# Patient Record
Sex: Male | Born: 1950
Health system: Southern US, Community
[De-identification: ages and names within clinical notes are randomized; demographics above are authoritative.]

## PROBLEM LIST (undated history)

## (undated) ENCOUNTER — Emergency Department (HOSPITAL_COMMUNITY): Discharge status: Absent without leave | Payer: BLUE CROSS/BLUE SHIELD

## (undated) DIAGNOSIS — N400 Enlarged prostate without lower urinary tract symptoms: Secondary | ICD-10-CM

## (undated) DIAGNOSIS — I639 Cerebral infarction, unspecified: Secondary | ICD-10-CM

## (undated) DIAGNOSIS — I2510 Atherosclerotic heart disease of native coronary artery without angina pectoris: Secondary | ICD-10-CM

## (undated) DIAGNOSIS — G459 Transient cerebral ischemic attack, unspecified: Secondary | ICD-10-CM

## (undated) DIAGNOSIS — Z95820 Peripheral vascular angioplasty status with implants and grafts: Secondary | ICD-10-CM

## (undated) DIAGNOSIS — H269 Unspecified cataract: Secondary | ICD-10-CM

## (undated) DIAGNOSIS — I679 Cerebrovascular disease, unspecified: Secondary | ICD-10-CM

## (undated) DIAGNOSIS — G8929 Other chronic pain: Secondary | ICD-10-CM

## (undated) DIAGNOSIS — E785 Hyperlipidemia, unspecified: Secondary | ICD-10-CM

## (undated) DIAGNOSIS — I219 Acute myocardial infarction, unspecified: Secondary | ICD-10-CM

## (undated) DIAGNOSIS — R202 Paresthesia of skin: Secondary | ICD-10-CM

## (undated) DIAGNOSIS — I6521 Occlusion and stenosis of right carotid artery: Secondary | ICD-10-CM

## (undated) DIAGNOSIS — I10 Essential (primary) hypertension: Secondary | ICD-10-CM

## (undated) DIAGNOSIS — R200 Anesthesia of skin: Secondary | ICD-10-CM

## (undated) DIAGNOSIS — Z9889 Other specified postprocedural states: Secondary | ICD-10-CM

## (undated) HISTORY — DX: Transient cerebral ischemic attack, unspecified: G45.9

## (undated) HISTORY — DX: Cerebrovascular disease, unspecified: I67.9

## (undated) HISTORY — PX: CARDIAC CATHETERIZATION: SHX172

## (undated) HISTORY — PX: MULTIPLE TOOTH EXTRACTIONS: SHX2053

## (undated) HISTORY — DX: Essential (primary) hypertension: I10

## (undated) HISTORY — DX: Hyperlipidemia, unspecified: E78.5

## (undated) HISTORY — PX: CAROTID ENDARTERECTOMY: SUR193

## (undated) HISTORY — DX: Atherosclerotic heart disease of native coronary artery without angina pectoris: I25.10

---

## 2008-09-18 ENCOUNTER — Inpatient Hospital Stay (HOSPITAL_COMMUNITY): Admission: EM | Admit: 2008-09-18 | Discharge: 2008-09-20 | Payer: Self-pay

## 2008-09-18 ENCOUNTER — Encounter: Payer: Self-pay | Admitting: Emergency Medicine

## 2008-09-18 ENCOUNTER — Ambulatory Visit: Payer: Self-pay | Admitting: Cardiology

## 2008-09-18 ENCOUNTER — Ambulatory Visit: Payer: Self-pay | Admitting: Diagnostic Radiology

## 2008-09-19 ENCOUNTER — Encounter (INDEPENDENT_AMBULATORY_CARE_PROVIDER_SITE_OTHER): Payer: Self-pay | Admitting: Neurology

## 2008-09-19 ENCOUNTER — Encounter (INDEPENDENT_AMBULATORY_CARE_PROVIDER_SITE_OTHER): Payer: Self-pay | Admitting: Internal Medicine

## 2008-09-23 ENCOUNTER — Telehealth: Payer: Self-pay | Admitting: Cardiology

## 2008-09-25 ENCOUNTER — Telehealth (INDEPENDENT_AMBULATORY_CARE_PROVIDER_SITE_OTHER): Payer: Self-pay | Admitting: *Deleted

## 2008-09-26 ENCOUNTER — Ambulatory Visit: Payer: Self-pay | Admitting: Cardiology

## 2008-09-26 ENCOUNTER — Encounter (INDEPENDENT_AMBULATORY_CARE_PROVIDER_SITE_OTHER): Payer: Self-pay

## 2008-09-26 ENCOUNTER — Ambulatory Visit: Payer: Self-pay

## 2008-09-26 DIAGNOSIS — R9430 Abnormal result of cardiovascular function study, unspecified: Secondary | ICD-10-CM | POA: Insufficient documentation

## 2008-09-26 DIAGNOSIS — G459 Transient cerebral ischemic attack, unspecified: Secondary | ICD-10-CM | POA: Insufficient documentation

## 2008-09-26 DIAGNOSIS — I10 Essential (primary) hypertension: Secondary | ICD-10-CM | POA: Insufficient documentation

## 2008-09-26 LAB — CONVERTED CEMR LAB
Basophils Relative: 0.3 % (ref 0.0–3.0)
Chloride: 105 meq/L (ref 96–112)
Creatinine, Ser: 0.8 mg/dL (ref 0.4–1.5)
Eosinophils Relative: 1.4 % (ref 0.0–5.0)
Hemoglobin: 15.2 g/dL (ref 13.0–17.0)
INR: 1 (ref 0.8–1.0)
Lymphocytes Relative: 23 % (ref 12.0–46.0)
MCV: 98 fL (ref 78.0–100.0)
Monocytes Absolute: 0.7 10*3/uL (ref 0.1–1.0)
Neutro Abs: 4.2 10*3/uL (ref 1.4–7.7)
Neutrophils Relative %: 65 % (ref 43.0–77.0)
RBC: 4.3 M/uL (ref 4.22–5.81)
Sodium: 143 meq/L (ref 135–145)
WBC: 6.5 10*3/uL (ref 4.5–10.5)

## 2008-09-27 ENCOUNTER — Ambulatory Visit (HOSPITAL_COMMUNITY): Admission: RE | Admit: 2008-09-27 | Discharge: 2008-09-28 | Payer: Self-pay | Admitting: Cardiology

## 2008-09-27 ENCOUNTER — Ambulatory Visit: Payer: Self-pay | Admitting: Cardiology

## 2008-09-27 DIAGNOSIS — I429 Cardiomyopathy, unspecified: Secondary | ICD-10-CM | POA: Insufficient documentation

## 2008-09-27 DIAGNOSIS — E785 Hyperlipidemia, unspecified: Secondary | ICD-10-CM | POA: Insufficient documentation

## 2008-09-30 ENCOUNTER — Telehealth: Payer: Self-pay | Admitting: Cardiology

## 2008-09-30 DIAGNOSIS — R079 Chest pain, unspecified: Secondary | ICD-10-CM | POA: Insufficient documentation

## 2008-10-01 ENCOUNTER — Inpatient Hospital Stay (HOSPITAL_COMMUNITY): Admission: RE | Admit: 2008-10-01 | Discharge: 2008-10-02 | Payer: Self-pay | Admitting: Cardiology

## 2008-10-01 ENCOUNTER — Encounter: Payer: Self-pay | Admitting: Cardiology

## 2008-10-01 ENCOUNTER — Ambulatory Visit: Payer: Self-pay | Admitting: Cardiology

## 2008-10-31 ENCOUNTER — Ambulatory Visit: Payer: Self-pay | Admitting: Cardiology

## 2008-10-31 DIAGNOSIS — I2589 Other forms of chronic ischemic heart disease: Secondary | ICD-10-CM

## 2008-10-31 DIAGNOSIS — I255 Ischemic cardiomyopathy: Secondary | ICD-10-CM | POA: Insufficient documentation

## 2008-10-31 DIAGNOSIS — I739 Peripheral vascular disease, unspecified: Secondary | ICD-10-CM

## 2008-10-31 DIAGNOSIS — I2510 Atherosclerotic heart disease of native coronary artery without angina pectoris: Secondary | ICD-10-CM | POA: Insufficient documentation

## 2008-10-31 DIAGNOSIS — I779 Disorder of arteries and arterioles, unspecified: Secondary | ICD-10-CM | POA: Insufficient documentation

## 2008-11-01 ENCOUNTER — Telehealth: Payer: Self-pay | Admitting: Cardiology

## 2008-11-01 ENCOUNTER — Encounter: Payer: Self-pay | Admitting: Cardiology

## 2008-11-01 ENCOUNTER — Ambulatory Visit: Payer: Self-pay

## 2008-11-08 ENCOUNTER — Ambulatory Visit: Payer: Self-pay | Admitting: Cardiology

## 2008-11-11 LAB — CONVERTED CEMR LAB
CO2: 29 meq/L (ref 19–32)
Chloride: 109 meq/L (ref 96–112)
Glucose, Bld: 107 mg/dL — ABNORMAL HIGH (ref 70–99)
Sodium: 143 meq/L (ref 135–145)

## 2008-12-09 ENCOUNTER — Ambulatory Visit: Payer: Self-pay | Admitting: Cardiology

## 2008-12-09 LAB — CONVERTED CEMR LAB
Albumin: 4.3 g/dL (ref 3.5–5.2)
HDL: 34.3 mg/dL — ABNORMAL LOW (ref >39.00)
LDL Cholesterol: 69 mg/dL (ref 0–99)
Total CHOL/HDL Ratio: 4
Triglycerides: 94 mg/dL (ref 0.0–149.0)

## 2008-12-25 ENCOUNTER — Telehealth: Payer: Self-pay | Admitting: Cardiology

## 2009-02-26 ENCOUNTER — Telehealth: Payer: Self-pay | Admitting: Cardiology

## 2009-02-27 ENCOUNTER — Telehealth: Payer: Self-pay | Admitting: Cardiology

## 2009-05-20 ENCOUNTER — Telehealth: Payer: Self-pay | Admitting: Cardiology

## 2009-10-08 ENCOUNTER — Telehealth: Payer: Self-pay | Admitting: Cardiology

## 2009-10-15 ENCOUNTER — Encounter (INDEPENDENT_AMBULATORY_CARE_PROVIDER_SITE_OTHER): Payer: Self-pay | Admitting: *Deleted

## 2009-12-26 ENCOUNTER — Ambulatory Visit: Payer: Self-pay | Admitting: Cardiology

## 2010-01-23 ENCOUNTER — Ambulatory Visit: Payer: Self-pay | Admitting: Cardiology

## 2010-01-23 ENCOUNTER — Encounter: Payer: Self-pay | Admitting: Cardiology

## 2010-01-23 ENCOUNTER — Ambulatory Visit: Payer: Self-pay

## 2010-01-23 ENCOUNTER — Ambulatory Visit (HOSPITAL_COMMUNITY)
Admission: RE | Admit: 2010-01-23 | Discharge: 2010-01-23 | Payer: Self-pay | Source: Home / Self Care | Admitting: Cardiology

## 2010-01-27 ENCOUNTER — Telehealth: Payer: Self-pay | Admitting: Cardiology

## 2010-01-27 ENCOUNTER — Encounter: Payer: Self-pay | Admitting: Cardiology

## 2010-01-27 LAB — CONVERTED CEMR LAB
AST: 28 units/L (ref 0–37)
Alkaline Phosphatase: 55 units/L (ref 39–117)
BUN: 9 mg/dL (ref 6–23)
Bilirubin, Direct: 0.1 mg/dL (ref 0.0–0.3)
GFR calc non Af Amer: 91.27 mL/min (ref >60)
LDL Cholesterol: 62 mg/dL (ref 0–99)
Potassium: 4.3 meq/L (ref 3.5–5.1)
Sodium: 140 meq/L (ref 135–145)
Total Bilirubin: 0.9 mg/dL (ref 0.3–1.2)
Total CHOL/HDL Ratio: 3
VLDL: 12 mg/dL (ref 0.0–40.0)

## 2010-01-29 ENCOUNTER — Encounter: Payer: Self-pay | Admitting: Cardiology

## 2010-03-24 NOTE — Progress Notes (Signed)
Summary: want to discuss plavix  Phone Note Call from Patient Call back at Home Phone [phone]   Caller: Patient Reason for Call: Talk to Nurse Summary of Call: pt needs a refill on Plavix but he has some questions and concerns prior to getting the refill done Initial call taken by: Omer Jack,  February 27, 2009 9:06 AM  Follow-up for Phone Call        Phone Call Completed Deliah Goody, RN  February 27, 2009 11:05 AM     Prescriptions: PLAVIX 75 MG TABS (CLOPIDOGREL BISULFATE) Take one tablet by mouth daily  #90 x 3   Entered by:   Deliah Goody, RN   Authorized by:   Ferman Hamming, MD, [facility]   Signed by:   Deliah Goody, RN on 02/27/2009   Method used:   Electronically to        [facility]* (mail-order)             ,          Ph: [phone]       Fax: [phone]   RxID:   [id number]

## 2010-03-24 NOTE — Letter (Signed)
Summary: Appointment - Reschedule  [facility], Main Office  [address]   [place], [place] [address]   Phone: [phone]  Fax: [phone]     October 15, 2009 MRN: [id number]   Adam Andrews [address] [place], [place]  [address]   Dear [name],   Due to a change in our office schedule, your appointment on  11-19-2009                     at     11:45a.m.    must be changed.  It is very important that we reach you to reschedule this appointment. We look forward to participating in your health care needs. Please contact us at the number listed above at your earliest convenience to reschedule this appointment.     Sincerely,       Lorne Skeens  [facility] Scheduling Team

## 2010-03-24 NOTE — Letter (Signed)
Summary: Custom - Lipid  [facility], Main Office  [address]   [place], [place] [address]   Phone: [phone]  Fax: [phone]     January 27, 2010 MRN: [id number]   Adam Andrews [address] [place], [place]  [address]   Dear Mr. GASPARINI,  We have reviewed your cholesterol results.  They are as follows:     Total Cholesterol:    107 (Desirable: less than 200)       HDL  Cholesterol:     33.00  (Desirable: greater than 40 for men and 50 for women)       LDL Cholesterol:       62  (Desirable: less than 100 for low risk and less than 70 for moderate to high risk)       Triglycerides:       60.0  (Desirable: less than 150)  Our recommendations include:  KEEP UP THE GOOD WORK NO CHANGES  AT THIS TIME.   Call our office at the number listed above if you have any questions.  Lowering your LDL cholesterol is important, but it is only one of a large number of "risk factors" that may indicate that you are at risk for heart disease, stroke or other complications of hardening of the arteries.  Other risk factors include:   A.  Cigarette Smoking* B.  High Blood Pressure* C.  Obesity* D.   Low HDL Cholesterol (see yours above)* E.   Diabetes Mellitus (higher risk if your is uncontrolled) F.  Family history of premature heart disease G.  Previous history of stroke or cardiovascular disease    *These are risk factors YOU HAVE CONTROL OVER.  For more information, visit .  There is now evidence that lowering the TOTAL CHOLESTEROL AND LDL CHOLESTEROL can reduce the risk of heart disease.  The American Heart Association recommends the following guidelines for the treatment of elevated cholesterol:  1.  If there is now current heart disease and less than two risk factors, TOTAL CHOLESTEROL should be less than 200 and LDL CHOLESTEROL should be less than 100. 2.  If there is current heart disease or two or more risk factors, TOTAL CHOLESTEROL should be  less than 200 and LDL CHOLESTEROL should be less than 70.  A diet low in cholesterol, saturated fat, and calories is the cornerstone of treatment for elevated cholesterol.  Cessation of smoking and exercise are also important in the management of elevated cholesterol and preventing vascular disease.  Studies have shown that 30 to 60 minutes of physical activity most days can help lower blood pressure, lower cholesterol, and keep your weight at a healthy level.  Drug therapy is used when cholesterol levels do not respond to therapeutic lifestyle changes (smoking cessation, diet, and exercise) and remains unacceptably high.  If medication is started, it is important to have you levels checked periodically to evaluate the need for further treatment options.  Thank you,    [facility] Team

## 2010-03-24 NOTE — Assessment & Plan Note (Signed)
Summary: f1y/dm   CC:  check up.  History of Present Illness: Pleasant gentleman I recently saw in July of this year following a TIA. An echocardiogram showed an ejection fraction of approximately 45%. We therefore scheduled an outpatient Myoview for risk stratification. This revealed a prior lateral infarct but no ischemia. Ejection fraction was 38%. The patient and subsequently underwent cardiac catheterization on August 6 of 2010. This revealed a 50% lesion near the origin of a septal, a 50% first diagonal, a 50-60% LAD at the takeoff of the second diagonal and a 50 to 60% second diagonal. The ramus intermedius was occluded but filled via collaterals. The right coronary artery had a 90% lesion. The patient  had a drug-eluting stent to the right coronary artery on August 10 of 2010. Note a previous MRA revealed a 50% right carotid stenosis and 55% right vertebral artery stenosis. I last saw him in September of 2010. Since then he denies any dyspnea on exertion, orthopnea, PND, pedal edema, palpitations, syncope or chest pain.   Current Medications (verified): 1)  Carvedilol 6.25 Mg Tabs (Carvedilol) .... Take One Tablet By Mouth Twice A Day 2)  Simvastatin 40 Mg Tabs (Simvastatin) .... Take One Tablet By Mouth Daily At Bedtime 3)  Aspirin Ec 325 Mg Tbec (Aspirin) .... Take One Tablet By Mouth Daily 4)  Ibuprofen 200 Mg Tabs (Ibuprofen) .... As Needed 5)  Plavix 75 Mg Tabs (Clopidogrel Bisulfate) .... Take One Tablet By Mouth Daily 6)  Nitroglycerin 0.4 Mg Subl (Nitroglycerin) .... One Tablet Under Tongue Every 5 Minutes As Needed For Chest Pain---May Repeat Times Three 7)  Lisinopril 2.5 Mg Tabs (Lisinopril) .... Take One Tablet By Mouth Daily 8)  Multivitamins   Tabs (Multiple Vitamin) .Marland Kitchen.. 1 Tab By Mouth Once Daily  Allergies: No Known Drug Allergies  Past History:  Past Medical History: Cerebrovascular disease DYSLIPIDEMIA (ICD-272.4) CARDIOMYOPATHY (ICD-425.4) HYPERTENSION, BENIGN  (ICD-401.1) TRANSIENT ISCHEMIC ATTACK (ICD-435.9) CAD  Social History: Reviewed history from 09/26/2008 and no changes required.  SOCIAL HISTORY:  The patient drinks about two beers per night.  [place]   [place]  He is in [employer].  He does not smoke.      Review of Systems       no fevers or chills, productive cough, hemoptysis, dysphasia, odynophagia, melena, hematochezia, dysuria, hematuria, rash, seizure activity, orthopnea, PND, pedal edema, claudication. Remaining systems are negative.   Vital Signs:  Patient profile:   60 year old male Height:      70 inches Weight:      204 pounds BMI:     29.38 Pulse rate:   62 / minute Resp:     14 per minute BP sitting:   153 / 79  (left arm)  Vitals Entered By: Kem Parkinson (December 26, 2009 4:07 PM)  Physical Exam  General:  Well-developed well-nourished in no acute distress.  Skin is warm and dry.  HEENT is normal.  Neck is supple. No thyromegaly.  Chest is clear to auscultation with normal expansion.  Cardiovascular exam is regular rate and rhythm.  Abdominal exam nontender or distended. No masses palpated. Extremities show no edema. neuro grossly intact    EKG  Procedure date:  12/26/2009  Findings:      Sinus rhythm at a rate of 62. First degree AV block. Left axis deviation.  Impression & Recommendations:  Problem # 1:  CARDIOMYOPATHY, ISCHEMIC (ICD-414.8) Continue beta blocker and ACE inhibitor. Repeat echocardiogram. His updated medication  list for this problem includes:    Carvedilol 6.25 Mg Tabs (Carvedilol) .Marland Kitchen... Take one tablet by mouth twice a day    Aspirin Ec 325 Mg Tbec (Aspirin) .Marland Kitchen... Take one tablet by mouth daily    Plavix 75 Mg Tabs (Clopidogrel bisulfate) .Marland Kitchen... Take one tablet by mouth daily    Nitroglycerin 0.4 Mg Subl (Nitroglycerin) ..... One tablet under tongue every 5 minutes as needed for chest pain---may repeat times three    Lisinopril 10 Mg Tabs (Lisinopril)  .Marland Kitchen... Take one tablet by mouth daily    Nitrostat 0.4 Mg Subl (Nitroglycerin) .Marland Kitchen... 1 tablet under tongue at onset of chest pain; you may repeat every 5 minutes for up to 3 doses.  Problem # 2:  CAD (ICD-414.00) Continue aspirin, Plavix, beta blocker, ACE inhibitor and statin. His updated medication list for this problem includes:    Carvedilol 6.25 Mg Tabs (Carvedilol) .Marland Kitchen... Take one tablet by mouth twice a day    Aspirin Ec 325 Mg Tbec (Aspirin) .Marland Kitchen... Take one tablet by mouth daily    Plavix 75 Mg Tabs (Clopidogrel bisulfate) .Marland Kitchen... Take one tablet by mouth daily    Nitroglycerin 0.4 Mg Subl (Nitroglycerin) ..... One tablet under tongue every 5 minutes as needed for chest pain---may repeat times three    Lisinopril 2.5 Mg Tabs (Lisinopril) .Marland Kitchen... Take one tablet by mouth daily    Nitrostat 0.4 Mg Subl (Nitroglycerin) .Marland Kitchen... 1 tablet under tongue at onset of chest pain; you may repeat every 5 minutes for up to 3 doses.  Problem # 3:  CAROTID STENOSIS (ICD-433.10)  Continue aspirin and statin. Schedule carotid Doppler. His updated medication list for this problem includes:    Aspirin Ec 325 Mg Tbec (Aspirin) .Marland Kitchen... Take one tablet by mouth daily    Plavix 75 Mg Tabs (Clopidogrel bisulfate) .Marland Kitchen... Take one tablet by mouth daily  Orders: Carotid Duplex (Carotid Duplex)  His updated medication list for this problem includes:    Aspirin Ec 325 Mg Tbec (Aspirin) .Marland Kitchen... Take one tablet by mouth daily    Plavix 75 Mg Tabs (Clopidogrel bisulfate) .Marland Kitchen... Take one tablet by mouth daily  Problem # 4:  DYSLIPIDEMIA (ICD-272.4)  Continue statin. Check lipids and liver. His updated medication list for this problem includes:    Simvastatin 40 Mg Tabs (Simvastatin) .Marland Kitchen... Take one tablet by mouth daily at bedtime  His updated medication list for this problem includes:    Simvastatin 40 Mg Tabs (Simvastatin) .Marland Kitchen... Take one tablet by mouth daily at bedtime  Problem # 5:  HYPERTENSION, BENIGN  (ICD-401.1)  Blood pressure elevated. Increase lisinopril to 10 mg p.o. daily. Check potassium and renal function. His updated medication list for this problem includes:    Carvedilol 6.25 Mg Tabs (Carvedilol) .Marland Kitchen... Take one tablet by mouth twice a day    Aspirin Ec 325 Mg Tbec (Aspirin) .Marland Kitchen... Take one tablet by mouth daily    Lisinopril 10 Mg Tabs (Lisinopril) .Marland Kitchen... Take one tablet by mouth daily  His updated medication list for this problem includes:    Carvedilol 6.25 Mg Tabs (Carvedilol) .Marland Kitchen... Take one tablet by mouth twice a day    Aspirin Ec 325 Mg Tbec (Aspirin) .Marland Kitchen... Take one tablet by mouth daily    Lisinopril 10 Mg Tabs (Lisinopril) .Marland Kitchen... Take one tablet by mouth daily  Other Orders: Echocardiogram (Echo)  Patient Instructions: 1)  Your physician recommends that you schedule a follow-up appointment in: ONE YEAR 2)  Your physician recommends that you return  for lab work ZO:XWRU DOPPLERS-LIPID/LIVER/BMP-272.0/V58.69/401.1 3)  Your physician has requested that you have a carotid duplex. This test is an ultrasound of the carotid arteries in your neck. It looks at blood flow through these arteries that supply the brain with blood. Allow one hour for this exam. There are no restrictions or special instructions. 4)  Your physician has requested that you have an echocardiogram.  Echocardiography is a painless test that uses sound waves to create images of your heart. It provides your doctor with information about the size and shape of your heart and how well your heart's chambers and valves are working.  This procedure takes approximately one hour. There are no restrictions for this procedure. 5)  Your physician has recommended you make the following change in your medication: INCREASE LISINOPRIL 10MG  ONCE DAILY Prescriptions: PLAVIX 75 MG TABS (CLOPIDOGREL BISULFATE) Take one tablet by mouth daily  #90 x 4   Entered by:   Deliah Goody, RN   Authorized by:   Ferman Hamming, MD,  [facility]   Signed by:   Deliah Goody, RN on 12/26/2009   Method used:   Print then Give to Patient   RxID:   [id number] PLAVIX 75 MG TABS (CLOPIDOGREL BISULFATE) Take one tablet by mouth daily  #30 x 12   Entered by:   Deliah Goody, RN   Authorized by:   Ferman Hamming, MD, [facility]   Signed by:   Deliah Goody, RN on 12/26/2009   Method used:   Electronically to        [facility]  [phone]* (retail)       [address]       [place], [place]  [address]       Ph: [phone] or [phone]       Fax: [phone]   RxID:   [id number] LISINOPRIL 10 MG TABS (LISINOPRIL) Take one tablet by mouth daily  #30 x 12   Entered by:   Deliah Goody, RN   Authorized by:   Ferman Hamming, MD, [facility]   Signed by:   Deliah Goody, RN on 12/26/2009   Method used:   Electronically to        [facility]  [phone]* (retail)       [address]       [place], [place]  [address]       Ph: [phone] or [phone]       Fax: [phone]   RxID:   [id number] NITROSTAT 0.4 MG SUBL (NITROGLYCERIN) 1 tablet under tongue at onset of chest pain; you may repeat every 5 minutes for up to 3 doses.  #25 x 12   Entered by:   Kem Parkinson   Authorized by:   Ferman Hamming, MD, [facility]   Signed by:   Kem Parkinson on 12/26/2009   Method used:   Electronically to        [facility]  [phone]* (retail)       [address]       [place], [place]  [address]       Ph: [phone] or [phone]       Fax: [phone]   RxID:   [id number]

## 2010-03-24 NOTE — Progress Notes (Signed)
Summary: SAMPLES OF PLAVIX  Phone Note Call from Patient Call back at Home Phone [phone] Call back at [phone]   Caller: Spouse/ROBIN Summary of Call: PT NEEDS SAMPLES OF PLAVIX Initial call taken by: Judie Grieve,  February 26, 2009 2:51 PM  Follow-up for Phone Call        debra has sent them samples of plavix out in the front lobby Follow-up by: Kem Parkinson,  February 27, 2009 10:58 AM

## 2010-03-24 NOTE — Progress Notes (Signed)
Summary: dose pt need to continue plavix  Phone Note Call from Patient Call back at Home Phone [phone]   Caller: Patient Reason for Call: Talk to Nurse, Talk to Doctor Summary of Call: pt is out of plavix and was under the impression he was only suppose to be on this for a year and it has been a year so dose he still need to refill or not  Initial call taken by: Omer Jack,  October 08, 2009 9:32 AM  Follow-up for Phone Call        spoke with pt, he had a DES in july 2010, he questioned if he could stop his plavix. will foward for dr Jens Som review. Deliah Goody, RN  October 08, 2009 10:40 AM   Additional Follow-up for Phone Call Additional follow up Details #1::        continue plavix for now Ferman Hamming, MD, [facility]  October 09, 2009 8:42 AM  pt aware, follow up appt made Deliah Goody, RN  October 10, 2009 11:32 AM     Prescriptions: PLAVIX 75 MG TABS (CLOPIDOGREL BISULFATE) Take one tablet by mouth daily  #90 x 3   Entered by:   Deliah Goody, RN   Authorized by:   Ferman Hamming, MD, [facility]   Signed by:   Deliah Goody, RN on 10/10/2009   Method used:   Electronically to        [facility]  [phone]* (retail)       [address]       [place], [place]  [address]       Ph: [phone] or [phone]       Fax: [phone]   RxID:   [id number]

## 2010-03-24 NOTE — Progress Notes (Signed)
Summary: pt wife request call  Phone Note Call from Patient Call back at [phone]   Caller: Spouse/ Robin Summary of Call: Pt  wife request call Initial call taken by: Judie Grieve,  January 27, 2010 4:43 PM  Follow-up for Phone Call        spoke with pt wife, he got out of bed to go to the bathroom got dizzy and fell. his lisinopril was increased at last visit. he does not check his bp on a daily basis but will track and let me know if it is running low. he is drinking plenty of fluids. she will call if happens again or if bp is running low Deliah Goody, RN  January 27, 2010 5:35 PM

## 2010-03-24 NOTE — Progress Notes (Signed)
Summary: speak to nurse  Phone Note Call from Patient Call back at Home Phone [phone]   Caller: Patient Reason for Call: Talk to Nurse Summary of Call: request call back from nurse, just would state he would speak to the nurse Initial call taken by: Migdalia Dk,  May 20, 2009 11:12 AM  Follow-up for Phone Call        spoke with pt, samples of plavix given Deliah Goody, RN  May 21, 2009 9:24 AM

## 2010-05-30 LAB — CBC
HCT: 39.1 % (ref 39.0–52.0)
Hemoglobin: 13.6 g/dL (ref 13.0–17.0)
Hemoglobin: 14.7 g/dL (ref 13.0–17.0)
MCHC: 34.7 g/dL (ref 30.0–36.0)
MCHC: 34.9 g/dL (ref 30.0–36.0)
MCV: 100.2 fL — ABNORMAL HIGH (ref 78.0–100.0)
MCV: 100.5 fL — ABNORMAL HIGH (ref 78.0–100.0)
RBC: 3.89 MIL/uL — ABNORMAL LOW (ref 4.22–5.81)
RBC: 4.24 MIL/uL (ref 4.22–5.81)
WBC: 6.4 10*3/uL (ref 4.0–10.5)
WBC: 7.6 10*3/uL (ref 4.0–10.5)

## 2010-05-30 LAB — BASIC METABOLIC PANEL
CO2: 26 mEq/L (ref 19–32)
Chloride: 106 mEq/L (ref 96–112)
Chloride: 107 mEq/L (ref 96–112)
Creatinine, Ser: 0.88 mg/dL (ref 0.4–1.5)
Creatinine, Ser: 0.9 mg/dL (ref 0.4–1.5)
GFR calc Af Amer: >60 mL/min (ref >60)
GFR calc Af Amer: >60 mL/min (ref >60)
Potassium: 3.6 mEq/L (ref 3.5–5.1)
Sodium: 138 mEq/L (ref 135–145)

## 2010-05-31 LAB — BASIC METABOLIC PANEL
BUN: 11 mg/dL (ref 6–23)
CO2: 28 mEq/L (ref 19–32)
Chloride: 105 mEq/L (ref 96–112)
Creatinine, Ser: 1 mg/dL (ref 0.4–1.5)
Glucose, Bld: 99 mg/dL (ref 70–99)

## 2010-05-31 LAB — POCT TOXICOLOGY PANEL

## 2010-05-31 LAB — DIFFERENTIAL
Basophils Relative: 0 % (ref 0–1)
Eosinophils Absolute: 0.1 10*3/uL (ref 0.0–0.7)
Eosinophils Relative: 2 % (ref 0–5)
Neutrophils Relative %: 63 % (ref 43–77)

## 2010-05-31 LAB — CARDIAC PANEL(CRET KIN+CKTOT+MB+TROPI)
CK, MB: 0.8 ng/mL (ref 0.3–4.0)
CK, MB: 0.9 ng/mL (ref 0.3–4.0)
CK, MB: 0.9 ng/mL (ref 0.3–4.0)
CK, MB: 1 ng/mL (ref 0.3–4.0)
Relative Index: INVALID (ref 0.0–2.5)
Total CK: 56 U/L (ref 7–232)
Troponin I: 0.01 ng/mL (ref 0.00–0.06)
Troponin I: 0.01 ng/mL (ref 0.00–0.06)
Troponin I: 0.02 ng/mL (ref 0.00–0.06)

## 2010-05-31 LAB — PROTIME-INR
INR: 0.9 (ref 0.00–1.49)
Prothrombin Time: 12.5 seconds (ref 11.6–15.2)

## 2010-05-31 LAB — URINALYSIS, ROUTINE W REFLEX MICROSCOPIC
Bilirubin Urine: NEGATIVE
Glucose, UA: NEGATIVE mg/dL
Hgb urine dipstick: NEGATIVE
Protein, ur: NEGATIVE mg/dL
Specific Gravity, Urine: 1.012 (ref 1.005–1.030)
Urobilinogen, UA: 0.2 mg/dL (ref 0.0–1.0)

## 2010-05-31 LAB — CBC
MCHC: 34.1 g/dL (ref 30.0–36.0)
MCV: 99.7 fL (ref 78.0–100.0)
Platelets: 212 10*3/uL (ref 150–400)

## 2010-05-31 LAB — HOMOCYSTEINE: Homocysteine: 12.8 umol/L (ref 4.0–15.4)

## 2010-05-31 LAB — LIPID PANEL
Cholesterol: 187 mg/dL (ref 0–200)
HDL: 39 mg/dL — ABNORMAL LOW (ref >39)
LDL Cholesterol: 120 mg/dL — ABNORMAL HIGH (ref 0–99)
Triglycerides: 140 mg/dL (ref <150)

## 2010-05-31 LAB — POCT CARDIAC MARKERS
Myoglobin, poc: 43.6 ng/mL (ref 12–200)
Troponin i, poc: <0.05 ng/mL (ref 0.00–0.09)

## 2010-05-31 LAB — TSH: TSH: 1.97 u[IU]/mL (ref 0.350–4.500)

## 2010-05-31 LAB — SEDIMENTATION RATE: Sed Rate: 3 mm/hr (ref 0–16)

## 2010-05-31 LAB — HEMOGLOBIN A1C: Mean Plasma Glucose: 126 mg/dL

## 2010-06-09 ENCOUNTER — Other Ambulatory Visit: Payer: Self-pay | Admitting: Cardiology

## 2010-07-07 NOTE — Cardiovascular Report (Signed)
Adam Andrews, Adam Andrews            ACCOUNT NO.:  [id number]   MEDICAL RECORD NO.:  [id number]          PATIENT TYPE:  INP   LOCATION:  3730                         FACILITY:  [facility]   PHYSICIAN:  Arturo Morton. Riley Kill, MD, FACCDATE OF BIRTH:  1950-02-27   DATE OF PROCEDURE:  09/27/2008  DATE OF DISCHARGE:                            CARDIAC CATHETERIZATION   INDICATIONS:  Adam Andrews is a 60 year old gentleman who recently  presented with a stroke to the Neuro Service.  He was seen in  consultation by Dr. Jens Som who demonstrated a reduced LV function by  echo.  He underwent radionuclide imaging which demonstrated a large  persistent defect in the lateral wall; however, the patient had exercise-  induced chest pain and ST depression with exercise, and I discussed the  case with Dr. Jens Som in detail.  He recommended that we bring him in  promptly for cardiac catheterization.  The patient is on new  antihypertensive medication.  He is also on aspirin.  Risks, benefits,  and alternatives were discussed with the patient, he consented to  proceed.   PROCEDURE:  1. Left heart catheterization.  2. Selective coronary arteriography.   DESCRIPTION OF PROCEDURE:  The procedure was performed from the femoral  artery using 5-French catheters.  A Williams right was used for the RCA.  We did measure pressures but did not perform ventriculography.  There  were no complications.  I spoke with his wife in detail.  He was taken  to the holding area in satisfactory clinical condition.   HEMODYNAMIC DATA:  1. Central aortic pressure was 154/76, mean 110.  2. Left ventricular pressure 167/15.  3. There was no gradient or pullback across the aortic valve.   ANGIOGRAPHIC DATA:  1. On plain fluoroscopy, there is some calcification of the coronary      arteries.  2. Left main is free of critical disease.  3. The LAD has scattered plaque throughout.  There is about 40 to no      more than 50% narrowing  near the origin of the septal and diagonal.      The diagonal itself has some ostial plaque of about 50%.  Just      distal to this, there is probably 50-60% narrowing of the LAD      overlapping the second diagonal which itself has probably 50-60%      narrowing in its ostium.  The distal LAD wraps the apex and      bifurcates.  4. The circumflex provides a ramus intermedius which is totally      occluded proximally.  It does fill by collaterals from both the      distal LAD system as well as from the right coronary.  The AV      circumflex itself is small but without critical narrowing.  5. The right coronary artery is somewhat tortuous.  There is segmental      plaque of about 50% near the proximal mid junction followed by a      focal 90% lesion just distal to this area.  The vessel then opens  up and provides a posterior descending and posterolateral branch      which then actually collateralizes the circumflex.   CONCLUSION:  1. Total occlusion of the circumflex coronary artery with retrograde      collaterals which likely explains the findings on radionuclide      imaging.  2. High-grade stenosis of the right coronary artery.  3. Scattered plaque in the left anterior descending system.   RECOMMENDATIONS:  1. The patient went home, and had his antihypertensive medication      reduced.  We need to optimize this as his pressure remains somewhat      high.  2. I have asked for neurology consult to comment on the potential for      giving the patient anticoagulation and more longer-term      thienopyridine use for placement of drug-eluting stent.  3. I would recommend revascularization given his chest pain and ST      depression and the fact that his circumflex is collateralized from      his right.  The LAD does not appear to be critical, and we would be      reluctant to recommend revascularization surgery at the present      time with his recent CVA event.  I have spoken  with the neurology      team, and they will see him in consultation and then we will make a      decision about the timing of percutaneous intervention of the RCA.      Arturo Morton. Riley Kill, MD, [facility]  Electronically Signed     TDS/MEDQ  D:  09/27/2008  T:  09/28/2008  Job:  [id number]   cc:   Madolyn Frieze. Jens Som, MD, [facility]  CV Laboratory.

## 2010-07-07 NOTE — Discharge Summary (Signed)
[name], [name]            ACCOUNT NO.:  [id number]   MEDICAL RECORD NO.:  [id number]          PATIENT TYPE:  INP   LOCATION:  3730                         FACILITY:  [facility]   PHYSICIAN:  Arturo Morton. Riley Kill, MD, FACCDATE OF BIRTH:  24-May-1950   DATE OF ADMISSION:  09/27/2008  DATE OF DISCHARGE:  09/28/2008                               DISCHARGE SUMMARY   PRIMARY CARDIOLOGIST:  Arturo Morton. Riley Kill, MD, FACC   NEUROLOGIST:  Pramod P. Pearlean Brownie, MD   DISCHARGING DIAGNOSES:  1. Chest pain, status post cardiac catheterization on September 27, 2008.      The patient with total occlusion of the circumflex coronary artery      with retrograde collaterals, which explains the findings on the      nuclear study.  2. High-grade stenosis of right coronary artery.  3. Scattered plaque in the left anterior descending.  The patient to      return on Tuesday for percutaneous coronary intervention to the      right coronary artery.  4. Recent acute left medial mid brain stroke on July 29, prompting      Neuro consult this admission prior to further intervention.  The      patient was seen in consultation on August 6 because of the small      size of the stroke in the location.  Overall, he was felt safe to      proceed with planned cardiac procedure and percutaneous coronary      intervention.  It is safe for him to receive anticoagulation and      antiplatelet agents per Dr. Terrace Arabia.   PAST MEDICAL HISTORY:  1. Hypertension.  2. CVA as stated above.   HOSPITAL COURSE:  Adam Andrews is a 60 year old gentleman seen by Dr.  Riley Kill on September 26, 2008, following a CVA back in July, at which time  the patient had an echo with no source of emboli, but reduced overall  EF.  He was set up for stress Myoview that was interpreted as abnormal  with a large lateral defect.  It was felt he needed cardiac  catheterization for further evaluation.  The patient in this admission  underwent cardiac catheterization  results as stated above.  Because of  recent CVA, Neuro saw the patient in consultation.  The patient cleared  for anticoagulation, antiplatelet therapy.  He will return on Tuesday  for intervention per Dr. Riley Kill.  He has Andrews loaded with 300 mg of  Plavix today prior to discharge.  We will send him home on 75 mg daily  in addition to his  Coreg 3.125 mg b.i.d., aspirin 325, nitroglycerin p.r.n., and Zocor 10  mg.  I have given him prescription for the Plavix 75 daily, and  nitroglycerin p.r.n.   DURATION OF DISCHARGE/ENCOUNTER:  Greater than 30 minutes.      Dorian Pod, ACNP      Arturo Morton. Riley Kill, MD, [facility]  Electronically Signed    MB/MEDQ  D:  09/28/2008  T:  09/28/2008  Job:  [id number]   cc:   Pramod P. Pearlean Brownie, MD

## 2010-07-07 NOTE — Discharge Summary (Signed)
Adam Andrews, Adam Andrews            ACCOUNT NO.:  [id number]   MEDICAL RECORD NO.:  [id number]           PATIENT TYPE:   LOCATION:                                 FACILITY:   PHYSICIAN:  Arturo Morton. Riley Kill, MD, FACCDATE OF BIRTH:  [birth year]   DATE OF ADMISSION:  DATE OF DISCHARGE:                               DISCHARGE SUMMARY   ADDENDUM   Please note that the followup appointment with Wende Bushy has been  changed to a followup appointment with the patient's primary  cardiologist, Dr. Olga Millers on October 29, 2008, at 3:30 p.m.  Please also note that I may have incorrectly dictated the patient's  primary cardiologist as Dr. Shawnie Pons.  In fact, it is Dr. Olga Millers.  Dr. Riley Kill has written a note after my rounding note today  to increase Coreg as tolerated as it is currently only 3.125 mg p.o.  b.i.d. and the patient still has mild to moderate hypertension.  Since  Wende Bushy is no longer going to be seeing the patient, we do not  need to CC her either this addendum with the original discharge summary.      Jarrett Ables, PAC      Arturo Morton. Riley Kill, MD, [facility]  Electronically Signed    MS/MEDQ  D:  10/02/2008  T:  10/03/2008  Job:  [id number]   cc:   Madolyn Frieze. Jens Som, MD, [facility]  Arturo Morton. Riley Kill, MD, [facility]

## 2010-07-07 NOTE — Consult Note (Signed)
Adam Andrews, Adam Andrews            ACCOUNT NO.:  [id number]   MEDICAL RECORD NO.:  [id number]          PATIENT TYPE:  INP   LOCATION:  3703                         FACILITY:  [facility]   PHYSICIAN:  Pramod P. Pearlean Brownie, MD    DATE OF BIRTH:  [birth year]   DATE OF CONSULTATION:  DATE OF DISCHARGE:                                 CONSULTATION   REFERRING PHYSICIAN:  Paula Libra, MD   REASON FOR REFERRAL:  TIA.   HISTORY OF PRESENT ILLNESS:  Adam Andrews is a 60 year old Caucasian  male who developed sudden onset of vision difficulties, dizziness, and  some speech difficulties at 2:30 p.m. today while at work.  He sat down  his couch for little while, but symptoms did not go away.  He in fact  fell asleep for 2-3 hours and when he woke up, symptoms persisted.  So,  he was brought to [facility] Emergency Room.  His  symptoms started improving there at about 6 o'clock.  But he feels he is  not back to his baseline.  He states that he had trouble focusing with  both eyes when he closed either eye.  He could see much better.  He  denies true diplopia, but his vision difficulties were binocular.  He  also had difficulty speaking and had to think about words in come out  free.  He also felt off balance and dizzy and was walking like a drunk.  These symptoms have improved, but not completely back to baseline.  Denies any headache, vertigo, focal extremity weakness and numbness.  There is no prior history of TIA or stroke or neurological problems.   PAST MEDICAL HISTORY:  Unknown and unremarkable as the patient does not  see doctors.   FAMILY HISTORY:  Significant for father having a stroke at age 41.  Several sisters and mother have heart disease.   HOME MEDICATIONS:  None.   PAST SURGICAL HISTORY:  None.   MEDICATIONS ALLERGIES:  None.   SOCIAL HISTORY:  The patient is married, lives with his wife in  [place].  He does not smoke or drinks.   REVIEW OF SYSTEMS:   Negative for any recent fever, cough, chest pain,  diarrhea, or illness.   PHYSICAL EXAMINATION:  GENERAL:  Pleasant middle-aged Caucasian male who  is currently not in distress.  VITAL SIGNS:  He is afebrile with pulse rate of 70 per minute, regular,  respiratory rate 16 per minute, distal pulses are well felt.  Blood  pressure 178/107, respiratory rate 20, oxygen sats 98%.  HEENT:  Head is nontraumatic.  Hearing is normal.  ENT exam is  unremarkable.  NECK:  Supple.  There is no bruit.  CARDIAC:  No murmur or gallop.  LUNGS:  Clear to auscultation.  ABDOMEN:  Soft, nontender.  NEUROLOGIC:  He is pleasant, awake, alert, cooperative.  Speech and  language appear normal.  Eye movements are full range.  There is no  nystagmus.  He has difficulty focusing when he is looking with both eyes  open, and read more clearly when either eyes closed.  Visual fields are  full to bedside testing.  Face is symmetric without weakness.  Tongue is  midline.  Motor system exam reveals no upper or lower extremity drift  with symmetric and equal strength without focal weakness.  Deep tendon  reflexes are 2+ symmetric.  Plantars are downgoing.  Finger-to-nose and  heel-to-heel coordinations are accurate.  Walks as a slightly broad-  based gait and complains of subjective dizziness while walking.   DATA REVIEWED:  Noncontrast CAT [facility] shows no acute abnormalities.   IMPRESSION:  A 60 year old gentleman with sudden onset of visual  difficulties, dizziness, imbalance, and speech difficulties likely due  to the small posterior circulation infarct with posterior circulation  transient ischemic attack.  He has no known risk factors, but has  clearly elevated blood pressure to suggest hypertension, which is likely  chronic back.  He has a strong family history of coronary artery  disease.   PLAN:  The patient will be admitted for observation of TIA workup.  Check  MRI scan of the brain with MRA of the brain and neck, 2-D  echocardiogram, fasting lipid profile, hemoglobin A1c.  Continue aspirin  for now.  Physical and Occupational Therapy consults.  I had a long  discussion with the patient regarding symptoms, planned for evaluation,  treatment, and answered questions.  We will be happy to follow the  patient in consults.  Kindly call for questions.           ______________________________  Sunny Schlein. Pearlean Brownie, MD     PPS/MEDQ  D:  09/19/2008  T:  09/19/2008  Job:  [id number]

## 2010-07-07 NOTE — H&P (Signed)
Adam Andrews NO.:  [id number]   MEDICAL RECORD NO.:  [id number]          PATIENT TYPE:  INP   LOCATION:  3703                         FACILITY:  [facility]   PHYSICIAN:  Massie Maroon, MD        DATE OF BIRTH:  Jun 27, 1950   DATE OF ADMISSION:  09/18/2008  DATE OF DISCHARGE:                              HISTORY & PHYSICAL   CHIEF COMPLAINT:  Vision change.   HISTORY OF PRESENT ILLNESS:  A 60 year old male with no past medical  history apparently felt dizzy at work and could not focus his vision.  This occurred about 2:00 p.m. earlier today.  The patient denies any  headache or vertigo, chest pain, palpitations, shortness of breath,  nausea, vomiting or diaphoresis.  He apparently took a nap at work and  his symptoms did not improve.  He noted that he was stumbling towards  the right.  He denies any focal weakness, numbness or tingling.  The  patient was brought to the ER for evaluation.  His CT scan brain showed  a small vague area of decreased attenuation in the left frontal lobe  periventricular white matter which is nonspecific, but can be seen in  the setting of chronic microvascular disease or migraine headaches among  other etiologies.  There was no hemorrhage, mass effect, hydrocephalus  or evidence of acute cortically based infarction.  The patient was  thought to have TIA type symptoms and thus admitted for TIA.  He was  also noted to have hypertension in the ED with a blood pressure of  178/107.   The patient will be admitted for workup of possible TIA, rule out CVA.   PAST MEDICAL HISTORY:  None.   PAST SURGICAL HISTORY:  None.   SOCIAL HISTORY:  The patient drinks about two beers per night.  [place]  He is in [employer].  He does not smoke.   FAMILY HISTORY:  His mother died at age 56 from a heart attack?  She was  a smoker.  Father died in his 47s of coronary disease and was a smoker.  Siblings:  One brother had  CABG at age 35 and was a smoker.  He has  three sisters, the oldest of whom had some heart problems and the middle  had CABG due to obesity.   REVIEW OF SYSTEMS:  Negative for cough, negative for fever, chills,  negative for all 10 organ systems except for pertinent positives stated  above.   PHYSICAL EXAMINATION:  VITAL SIGNS:  Temperature 98.1, pulse 86, blood  pressure 178/107, respiratory rate 20, pulse oximetry 98% on room air.  HEENT:  Anicteric, EOMI, no nystagmus, pupils 2 mm, symmetric, direct,  consensual, near reflexes intact.  Mucous membranes moist.  Tongue  midline.  NECK:  No JVD, no bruit, no thyromegaly, no adenopathy.  HEART:  Regular rate and rhythm.  S1-S2.  No murmurs, gallops or rubs.  LUNGS:  Clear to auscultation bilaterally.  ABDOMEN:  Soft, nontender, nondistended.  Positive bowel sounds.  EXTREMITIES:  No cyanosis, clubbing or edema.  NEURO:  Cranial nerves II-XII intact.  Reflexes 2+, symmetric, diffuse  with downgoing toes bilaterally, motor strength 5/5 in all four  extremities, pinprick intact.  Gait unable to test.  No resting tremor.   LABORATORY DATA:  Urine drug screen negative.  Troponin-I less than  0.05.  Sodium 143, potassium 4.0, chloride 105, bicarb 28, BUN 11,  creatinine 1.0, PTT 26, PT 12.5, INR 0.9.  Urinalysis is negative.  WBC  6.9, hemoglobin 15.9, platelet count 212.   EKG showed normal sinus rhythm at 90, normal axis, normal intervals,  early R-wave progression, no ST-T segment changes consistent with acute  ischemia.   Chest x-ray negative for any acute process.   CT brain:  See above.   ASSESSMENT/PLAN:  1. Dizziness, possibly secondary to transient ischemic attack,      hypertensive emergency, cerebrovascular accident.  We will check an      MRI with and without contrast of the brain.  We will check carotid      ultrasound and a cardiac echo.  We will also check lipid panel,      hemoglobin A1c, TSH, CBC, CMP, homocystine,  sed rate, CRP, ANA,      B12, folic acid, RPR.  The patient will be started on enteric-      coated aspirin 325 mg p.o. daily, Crestor 20 mg p.o. nightly,      carvedilol 3.125 mg p.o. b.i.d. and lisinopril 5 mg p.o. daily.  2. DVT prophylaxis.  Lovenox 40 mg subcu daily.      Massie Maroon, MD  Electronically Signed     JYK/MEDQ  D:  09/19/2008  T:  09/19/2008  Job:  [id number]

## 2010-07-07 NOTE — Consult Note (Signed)
Adam Andrews, Adam Andrews            ACCOUNT NO.:  [id number]   MEDICAL RECORD NO.:  [id number]          PATIENT TYPE:  INP   LOCATION:  3704                         FACILITY:  [facility]   PHYSICIAN:  Madolyn Frieze. Jens Som, MD, FACCDATE OF BIRTH:  Nov 29, 1950   DATE OF CONSULTATION:  09/20/2008  DATE OF DISCHARGE:  09/20/2008                                 CONSULTATION   The patient is a 60 year old male without prior cardiac history,  admitted with a CVA on September 18, 2008.  I am asked to evaluate for  cardiomyopathy.  Note he has no prior history of dyspnea on exertion,  orthopnea, PND, pedal edema, chest pain, palpitations, or syncope.  He  was admitted on September 18, 2008, with complaints of double vision and  being dizzy.  He has been diagnosed with a small brain stem infarct.  An echocardiogram during this admission revealed an ejection fraction of  approximately 45%.  There was hypokinesis of the mid inferior wall.  There is grade 1 diastolic dysfunction.  Because of the above, we were  asked to further evaluate.   MEDICATIONS:  His medications at present include:  1. Enoxaparin 40 mg subcu daily.  2. Aspirin 325 mg p.o. daily.  3. Crestor 20 mg p.o. daily.  4. Carvedilol 3.125 mg p.o. b.i.d.  5. Lisinopril 5 mg p.o. daily.   ALLERGIES:  He has no known drug allergies.   SOCIAL HISTORY:  He has a remote history of tobacco use, but does not  smoke in 15 years.  He is married, with 1 child.  He consumes a case of  beer per a week.   FAMILY HISTORY:  Positive for coronary artery disease in his brother.   PAST MEDICAL HISTORY:  Significant for probable hypertension.  It should  be noted that the patient does not see physicians nor does he take his  blood pressure at home.  However, on presentation, his blood pressure  was 170/100.  He denies any history of diabetes or hypertension.  There  is no other medical problems.  He has had no previous surgeries.   REVIEW OF SYSTEMS:  He denies  any headaches, fevers, or chills.  There  is no productive cough or hemoptysis.  There is no dysphagia,  odynophagia melena, or hematochezia.  There is no dysuria or hematuria.  There is no rash or seizure activity.  There is no orthopnea, PND, or  pedal edema.  There is no claudication noted.  Remaining systems are  negative.   PHYSICAL EXAMINATION:  VITAL SIGNS:  Today, blood pressure 155/87 and  his pulse is 78.  His temperature is 96.9.  He is 95% on room air.  GENERAL:  He is well developed and well nourished, in no acute distress.  He does not appear to be depressed.  There is no peripheral clubbing.  SKIN:  Warm and dry.  BACK:  Normal.  HEENT:  Normal with normal eyelids.  NECK:  Supple with a normal upstroke bilaterally.  No bruits noted.  There is no jugular venous distention.  I cannot appreciate thyromegaly.  CHEST:  Clear to  auscultation and normal expansion.  CARDIOVASCULAR:  Regular rate and rhythm.  Normal S1 and S2.  There are  no murmurs, rubs, or gallops noted.  ABDOMEN:  Nontender and nondistended.  Positive bowel sounds.  No  hepatosplenomegaly.  No mass appreciated.  There is no abdominal bruit.  He has 2+ femoral pulses bilaterally.  No bruits.  EXTREMITIES:  No edema.  I can palpate no cords.  He has 2+ dorsalis  pedis pulses bilaterally.  NEUROLOGIC:  Grossly intact.  He does continue to have some blurred  vision.   Cardiac markers have been negative.  A TSH was normal at 1.97.  His  sodium was 143 with a potassium of 4.0.  BUN and creatinine are 11 and  1.0 respectively.  His white blood cell count was 6.9 with a hemoglobin  of 15.9, hematocrit 46.7.  His platelet count is 212.  His  echocardiogram as described in the HPI.  He did have an MRI/MRA of his  brain.  There was a possible punctate infarct in the medial left mid  midbrain versus infarct.  There was 50% stenosis in the right carotid  bifurcation and proximal right internal carotid artery.  Right  vertebral  artery had a stenosis of 55%.  His electrocardiogram shows sinus rhythm  at a rate of 69.  There is left anterior fascicular block.  There is  lateral T-wave inversion.  QT is prolonged.   DIAGNOSES:  1. Cardiomyopathy - etiology of this is unclear.  Note his blood      pressure on presentation was 170/100 and he has a history of not      seen physicians and also not checking his blood pressure.      Certainly, hypertension could be playing a role.  There could also      be a contribution from alcohol as he drinks a case of beer per      week.  Coronary artery disease could also be possible.  Note TSH      was normal.  We will plan to continue with his Coreg and ACE      inhibitor.  We can titrate this as an outpatient.  I will plan to      schedule a Myoview for risk stratification following discharge.  If      it is normal, then we will plan to repeat his echocardiogram 3      months after his medications have been titrated.  Hopefully, we      will normalize once his blood pressure is controlled and he abstain      from alcohol.  2. Cerebrovascular disease - he will continue on statin and he will      need lipids and liver in 6 weeks.  He will also continue on his      aspirin.  3. Hypertension - as per above, we will titrate his blood pressure      medications as an outpatient.  I will see him back in 2-4 weeks      following discharge.      Madolyn Frieze Jens Som, MD, [facility]  Electronically Signed     BSC/MEDQ  D:  09/20/2008  T:  09/21/2008  Job:  [id number]

## 2010-07-07 NOTE — Cardiovascular Report (Signed)
NAMELY, BACCHI            ACCOUNT NO.:  [id number]   MEDICAL RECORD NO.:  [id number]          PATIENT TYPE:  INP   LOCATION:  2507                         FACILITY:  [facility]   PHYSICIAN:  Arturo Morton. Riley Kill, MD, FACCDATE OF BIRTH:  [birth year]   DATE OF PROCEDURE:  10/01/2008  DATE OF DISCHARGE:                            CARDIAC CATHETERIZATION   INDICATIONS:  Mr. Hammond is a 60 year old gentleman who presented to  the hospital with a small stroke.  He was seen by Neurology.  Echocardiogram suggested reduced LV function.  He was set up for an  outpatient radionuclide imaging study by Dr. Jens Som, this demonstrated  a large fixed defect in the inferolateral segment; however, the patient  had onset of chest pain and significant ST depression.  He had not had a  prior history of angina.  Per our discussion, cardiac catheterization  was then recommended.  He underwent diagnostic catheterization  demonstrating total occlusion of the ramus intermedius as a flush  occlusion proximally.  This was collateralized both by the left and also  by the right coronary.  The right coronary artery was really quite large  and had a focal 80% plus stenosis in the midportion.  Based on the  strongly positive exercise test and the lack of any symptoms in the  past, percutaneous coronary intervention was recommended for silent  ischemia.  Risks, benefits, and alternatives were discussed with the  patient in detail.  He has been on antihypertensive therapy.  He has  been on Plavix over the weekend.  He had been pretreated with both  aspirin and clopidogrel.  The patient understood and consented to the  risks and was brought to the laboratory for further evaluation.   PROCEDURE:  Percutaneous stenting of the right coronary artery using a  drug-eluting platform.   DESCRIPTION OF PROCEDURE:  The patient was brought to the  Catheterization Laboratory, prepped and draped in usual fashion.  Through an  anterior puncture, the femoral artery was entered and a 6-  [place] sheath was placed.  Bivalirudin was then administered according  to protocol.  Following this, diagnostic views were obtained with a JR-4  guiding catheter with side holes.  ACT was then checked and found to be  in excess of 300 seconds.  A traverse wire was then placed down the  right coronary artery, and predilatation done with a 2.25 x 15 Apex  balloon.  We then looked at the segment of the disease to try to  minimize geographic miss.  The lesion was severe and short and fairly  focal, but the amount of disease both before and after were segmental  but not highly obstructive.  We focused on placing a single stent.  A  3.0 x 28 mm Xience V drug-eluting stent was then carefully placed and  deployed at 14 atmospheres.  We then subsequently did postdilatations,  with a 3.5 x 20 Apex Quantum balloon.  Postdilatation pressures were as  high as 16 atmospheres in the center the stent and 8, 9 atmospheres on  the edges.  There were no major complications.  Angiographic result was  excellent.  All catheters were subsequently removed and the femoral  sheath was sewn in place.  He was taken to the holding area in  satisfactory and clinical condition.   ANGIOGRAPHIC DATA:  The right coronary artery is fairly large-caliber  vessel.  There is a mid high-grade 80% eccentric stenosis noted.  There  is perhaps mild plaquing in the PDA proximally but not obstructive.  The  vessel was a large caliber artery.  Following dilatation, this 80%  stenosis was reduced to 0% with an excellent angiographic appearance.  The final angiographic views were satisfactory.   CONCLUSION:  1. Successful percutaneous stenting of the right coronary artery using      a drug-eluting platform.  2. Recent hypertension.  3. Recent stroke.   DISPOSITION:  1. The patient will be treated with dual antiplatelet therapy going      forward.  2. There will be  close followup with Dr. Olga Millers.  3. Plavix will be continued for minimum of 1 year and thereafter at      discretion of the patient's primary cardiologist.  4. Blood pressure control and risk factor reduction will be strongly      recommended based on his other disease.   ADDENDUM:  Neurologic consultation was obtained prior to PCI.      Arturo Morton. Riley Kill, MD, [facility]  Electronically Signed     TDS/MEDQ  D:  10/01/2008  T:  10/01/2008  Job:  [id number]   cc:   Madolyn Frieze. Jens Som, MD, [facility]  CV Laboratory

## 2010-07-07 NOTE — Discharge Summary (Signed)
Adam Andrews, Adam Andrews            ACCOUNT NO.:  [id number]   MEDICAL RECORD NO.:  [id number]           PATIENT TYPE:   LOCATION:                                 FACILITY:   PHYSICIAN:  Ruthy Dick, MD    DATE OF BIRTH:  [birth year]   DATE OF ADMISSION:  DATE OF DISCHARGE:                               DISCHARGE SUMMARY   REASON FOR ADMISSION:  Visual changes.   FINAL DISCHARGE DIAGNOSES:  1. Acute small brain stem infarction (acute cerebrovascular accident)  2. Systolic cardiomyopathy with low ejection fraction.  3. Hypertension.  4. Right carotid occlusion, not critical at this time.  5. Dyslipidemia.   CONSULTATIONS DURING THIS ADMISSION:  1. Neurology consult for stroke.  2. Cardiology consult for low ejection fraction and 2-D echo.   PROCEDURES DONE DURING THIS ADMISSION:  1. CT scan of the head, done on September 10, 2008, this was read as having      small vague area of decreased attenuation in the left frontal lobe,      which was nonspecific but can be seen in the setting of chronic      microvascular disease.  No hemorrhage or acute intracranial      abnormality was noted in that study.  2. MRI of the head:  This was read as possible punctate infarct in the      medial left mid brain versus artifact.  3. MRA of the neck showed atherosclerosis suspected at the right      carotid bifurcation and in the proximal right internal carotid      artery with a stenosis of about 50% in the distal vessel.  There      was also a dominant right vertebral artery with atherosclerosis in      the right proximal segment, resulting in stenosis of 55%.  4. MRA of the head was done, which showed mildly degraded by motion      but negative intracranial MRA.  5. A 2-D echocardiogram was done, which showed an estimated ejection      fraction of about 45% with hypokinesis of the mid inferior      myocardium, and there was no cardiac source of emboli identified.   BRIEF HISTORY OF PRESENTING  ILLNESS AND HOSPITAL COURSE:  This is a 60-  year-old gentleman with no past medical history, who apparently felt  dizzy while at work and could not focus his vision.  This had occurred  at about 2:00 p.m. on the day of presentation.  He denied other symptoms  except for the visual symptoms.  As noted above, study shows that the  patient had a possible acute CVA on the left.  The patient's visual  symptoms are finally resolved today with his vision being almost back to  normal.  As also noted above, the patient was noted to have very low  ejection fraction of no clear etiology and because of this, we have  asked Cardiology to see the patient.  At the time of this dictation, the  patient has not yet been seen by the Cardiology team even though  they  were consulted yesterday.  We will await the inputs and depending on  what this patient may be evaluated for further cardiac wise in the  hospital or as outpatient.   On discharge, this patient would be on:  1. Lisinopril 5 mg daily.  2. Coreg 3.125 mg b.i.d.  3. Zocor p.o. nightly.  4. Aspirin enteric coated 325 mg p.o. daily.   He is to follow with the cardiologist service of his choice as  scheduled.  He is also to follow up with a primary care physician in  about 1-2 weeks, and social service to help with finding one.  He is to  follow up with Dr. Pearlean Brownie, Neurology, in about 3-4 weeks.  Follow up of  the patient's right carotid stenosis would be by the primary care  physician and also by the neurologist.  At this time, the patient is not  symptomatic as far as the carotid stenosis is concerned and because of  this, there is no reason for vascular surgery consult.   PHYSICAL EXAMINATION:  VITAL SIGNS:  Today are temperature 98.6, pulse  75, respirations 20, blood pressure 142/78, and saturating 95% on room  air.  HEENT: Normocephalic, atraumatic.  Pupils equal, round, and reactive to  light.  Extraocular muscles intact.  CHEST:  Clear  to auscultation bilaterally.  No rhonchi, no rales, no  wheezing.  ABDOMEN:  Soft, nontender.  No hepatosplenomegaly.  EXTREMITIES:  No clubbing, no cyanosis, no edema.  CARDIOVASCULAR:  First and second heart sounds only.  CENTRAL NERVOUS SYSTEM:  Nonfocal.      Ruthy Dick, MD  Electronically Signed     GU/MEDQ  D:  09/20/2008  T:  09/21/2008  Job:  [id number]   cc:   Pramod P. Pearlean Brownie, MD  [facility]

## 2010-07-07 NOTE — Discharge Summary (Signed)
Adam Andrews, Adam Andrews            ACCOUNT NO.:  [id number]   MEDICAL RECORD NO.:  [id number]          PATIENT TYPE:  INP   LOCATION:  2507                         FACILITY:  [facility]   PHYSICIAN:  Arturo Morton. Riley Kill, MD, FACCDATE OF BIRTH:  Dec 07, 1950   DATE OF ADMISSION:  10/01/2008  DATE OF DISCHARGE:  10/02/2008                               DISCHARGE SUMMARY   DISCHARGE DIAGNOSIS:  Coronary artery disease.  A.  Total occlusion of circumflex coronary artery with retrograde  collaterals, high-grade stenosis of right coronary artery, scattered  plaque in the left anterior descending system.  B.  Successful percutaneous stenting of the RCA using a drug-eluting  platform.   SECONDARY DIAGNOSES:  1. Hypertension.  2. Dyslipidemia.  3. Ischemic cardiomyopathy with EF in the range of 40% to 50%.  4. Status post CVA.      a.     Acute left medial mid brain stroke on September 19, 2008 (states       he received anticoagulation and antiplatelet agents per Dr. Terrace Arabia).   ALLERGIES:  NKDA.   PROCEDURES:  1. EKG, October 01, 2008, normal sinus rhythm with lateral T-wave      flattening, no significant Q-waves, left axis deviation, no      evidence of hypertrophy.  Intervals within normal limits.  2. Cardiac catheterization, October 01, 2008, successful PCI of the RCA      with drug-eluting platform.  3. EKG October 02, 2008, no significant change from prior tracing.   HISTORY OF PRESENT ILLNESS:  Adam Andrews is a 60 year old white male  with known coronary artery disease per cardiac cath last week (September 27, 2008).  He had no nuclear defect laterally, but exercise-induced chest  pain and ST depression.  He has had a recent CVA.  Neurology was  consulted.  Blood pressure has been treated.  Per cath last Friday, RCA  was large and collateralizes the circumflex territory.  Dr. Riley Kill has  reviewed with Dr. Shirlee Latch and recommend PCI of the RCA with DES.  Risks  and benefits were explained to the  patient extensively, and the patient  encouraged to continue with planned PCI.   HOSPITAL COURSE:  The patient admitted and underwent procedures as  described above.  He tolerated them well without any significant  complications. The patient was enrolled in the ADAPT-DES __________  clinical trials during brief hospital course.  The patient was  asymptomatic on the morning of October 02, 2008, and worked with cardiac  rehab phase I ambulating 440 feet without any symptoms.  He was seen and  assessed by Dr. Riley Kill and deemed stable for discharge on that day as  well.  The patient will follow up with [facility] on October 16, 2008, at 11 a.m. with Adam Andrews.   The patient's home medications which include full strength aspirin,  Plavix 75 mg p.o. daily, low-dose statin, and low-dose beta-blocker will  continue at discharge.   At the time of discharge, the patient will be given his medication list,  prescription refills for nitroglycerin, followup instructions, and post-  cath instructions.  All  questions and concerns will be addressed at that  time.   DISCHARGE LABS:  WBC 6.4, HGB 13.6, HCT 39.1, PLT count 160.  Sodium  138, potassium 3.6, chloride 106, CO2 of 26, BUN 7, creatinine 0.88,  glucose 112, calcium 8.6.   FOLLOWUP PLANS AND APPOINTMENTS:  Please see hospital course.   DISCHARGE MEDS:  Please see computer list.   Duration of discharge encounter including physician time was 45 minutes.      Jarrett Ables, PAC      Arturo Morton. Riley Kill, MD, [facility]  Electronically Signed    MS/MEDQ  D:  10/02/2008  T:  10/02/2008  Job:  [id number]   cc:   Adam Reedy, PA-C  Arturo Morton Riley Kill, MD, [facility]

## 2010-07-07 NOTE — Consult Note (Signed)
Adam Andrews, Adam Andrews            ACCOUNT NO.:  [id number]   MEDICAL RECORD NO.:  [id number]          PATIENT TYPE:  INP   LOCATION:  3730                         FACILITY:  [facility]   PHYSICIAN:  Levert Feinstein, MD          DATE OF BIRTH:  May 08, 1950   DATE OF CONSULTATION:  09/27/2008  DATE OF DISCHARGE:                                 CONSULTATION   REFERRING PHYSICIAN:  Arturo Morton. Riley Kill, MD, [facility]   CHIEF COMPLAINT:  Whether the patient is safe to receive  anticoagulation/antiplatelet agent treatment during angioplasty.   HISTORY OF PRESENT ILLNESS:  The patient is a very pleasant 60 year old  right-handed Caucasian male, who was admitted through cardiologist Dr.  Rosalyn Charters service after cardiac catheterization, which has demonstrated  multivessel disease.  He is planning on to have angioplasty versus  stenting treatment.   He has past medical history of an acute left medial mid brain stroke on  September 19, 2008, presenting with acute onset of double vision, dizziness,  veered to the right side, symptoms last about 2-1/2 days.  For the  workup, he had received MRI of the brain which has confirmed acute  stroke in the left medial mid brain, very small size, and in addition,  there is old left subcortical white matter disease.   Cardiac workup has demonstrated ejection fraction of 45%, hypokinesia of  mid inferior myocardium, which is leading to his current cardiac  catheterization.   In addition, MRA of the brain and neck has demonstrated 50% right  internal carotid stenosis, right vertebral artery stenosis in the  proximal segments.   The patient also had a history of hypertension, remote smoking,  dyslipidemia, is on aspirin daily.   There was no more recurrent focal neurological deficit.   PAST MEDICAL HISTORY:  1. Small brain stem stroke.  2. Cardiomyopathy with low ejection fraction.  3. Hypertension.  4. Dyslipidemia.   SURGICAL HISTORY:  None.   SOCIAL HISTORY:   Drinks 2 beers every night.  [place] Quit smoking 15 years ago.   FAMILY HISTORY:  Mother died at age 47 from heart attack.  She was also  a smoker.  Father died in 75s from coronary artery disease, was also a  smoker, and the patient had a sibling at age 31 suffered coronary artery  disease.  He has three sisters.  One of the sister also suffered obesity  and coronary artery disease.   ALLERGIES:  No known drug allergy.   CURRENT MEDICATIONS:  Aspirin, Valium, labetalol, Versed, and  nitroglycerin p.r.n.   PHYSICAL EXAMINATION:  VITAL SIGNS:  Blood pressure is 144/84,  temperature 97.9, heart rate of 68, and respiration of 18.  CARDIAC:  Regular rate and rhythm.  NEUROLOGIC:  He is awake, alert, oriented to history taking, care of  conversation.  Cranial nerves II through XII were normal.  Motor  examination limited on the right lower extremity, status post catheter.  Otherwise, nonfocal.  Sensory was normal to light touch, pinprick.  Deep  tendon reflex normal, symmetric.  There was no dysmetria.  Gait was  deferred.   ASSESSMENT/PLAN:  A 60 year old with previous small medial left mid  brain stroke, fully recovery, coronary artery disease.  Because of his  small size of stroke, and the location, overall, he is safe to proceed  with planned cardiac procedure, it is safe for him to receive  anticoagulation, antiplatelet agents because the small size of brain  stem stroke less likely have hemorrhagic transformation.      Levert Feinstein, MD  Electronically Signed     YY/MEDQ  D:  09/27/2008  T:  09/28/2008  Job:  [id number]

## 2010-08-20 ENCOUNTER — Encounter: Payer: Self-pay | Admitting: Cardiology

## 2010-12-28 ENCOUNTER — Other Ambulatory Visit: Payer: Self-pay | Admitting: Cardiology

## 2010-12-31 ENCOUNTER — Other Ambulatory Visit: Payer: Self-pay | Admitting: Cardiology

## 2011-01-07 ENCOUNTER — Other Ambulatory Visit: Payer: Self-pay | Admitting: Cardiology

## 2011-01-28 ENCOUNTER — Encounter: Payer: Self-pay | Admitting: *Deleted

## 2011-01-29 ENCOUNTER — Ambulatory Visit (INDEPENDENT_AMBULATORY_CARE_PROVIDER_SITE_OTHER): Payer: BC Managed Care – PPO | Admitting: Cardiology

## 2011-01-29 ENCOUNTER — Encounter: Payer: Self-pay | Admitting: Cardiology

## 2011-01-29 DIAGNOSIS — E785 Hyperlipidemia, unspecified: Secondary | ICD-10-CM

## 2011-01-29 DIAGNOSIS — Z79899 Other long term (current) drug therapy: Secondary | ICD-10-CM

## 2011-01-29 DIAGNOSIS — I2510 Atherosclerotic heart disease of native coronary artery without angina pectoris: Secondary | ICD-10-CM

## 2011-01-29 NOTE — Assessment & Plan Note (Addendum)
Blood pressure controlled. Continue present medications. Check potassium and renal function. I encouraged patient to find a primary care physician or noncardiac issues including colonoscopy and prostate exams.

## 2011-01-29 NOTE — Assessment & Plan Note (Signed)
Continue aspirin and statin. Discontinue Plavix as we are now over 2 years since previous stent placed. Continue risk factor modification.

## 2011-01-29 NOTE — Assessment & Plan Note (Signed)
Continue statin. Check lipids and liver. 

## 2011-01-29 NOTE — Patient Instructions (Addendum)
Your physician wants you to follow-up ZO:XWRU WITH DR Jens Som  You will receive a reminder letter in the mail two months in advance. If you don't receive a letter, please call our office to schedule the follow-up appointment. Your physician has recommended you make the following change in your medication: STOP PLAVIX  Your physician recommends that you return for lab work in: FASTING LIPID LIVER AND BMET DX 401.1  V58.69  02-05-11  AT 8:30 AM

## 2011-01-29 NOTE — Assessment & Plan Note (Signed)
Continue ACE inhibitor and beta blocker. 

## 2011-01-29 NOTE — Assessment & Plan Note (Addendum)
Continue aspirin and statin. Patient due for carotid dopplers but declines for financial reasons; understands risk of CVA.

## 2011-01-29 NOTE — Progress Notes (Signed)
EAV:[id number] gentleman I previously saw following a TIA. An echocardiogram showed an ejection fraction of approximately 45%. We therefore scheduled an outpatient Myoview for risk stratification. This revealed a prior lateral infarct but no ischemia. Ejection fraction was 38%. The patient and subsequently underwent cardiac catheterization on August 6 of 2010. This revealed a 50% lesion near the origin of a septal, a 50% first diagonal, a 50-60% LAD at the takeoff of the second diagonal and a 50 to 60% second diagonal. The ramus intermedius was occluded but filled via collaterals. The right coronary artery had a 90% lesion. The patient  had a drug-eluting stent to the right coronary artery on August 10 of 2010. Note a previous MRA revealed a 50% right carotid stenosis and 55% right vertebral artery stenosis. Carotid dopplers in Dec 2011 showed a 40-59 right and a 0-39 left and fu recommended in one year. Echo in Dec 2011 showed EF 35-40, inferoposterior and lateral HK, mild LAE. I last saw him in Nov 2011. Since then, the patient denies any dyspnea on exertion, orthopnea, PND, pedal edema, palpitations, syncope or chest pain.    Current Outpatient Prescriptions  Medication Sig Dispense Refill  . aspirin 325 MG EC tablet Take 325 mg by mouth daily.        . carvedilol (COREG) 6.25 MG tablet TAKE ONE TABLET BY MOUTH TWICE DAILY  60 tablet  12  . ibuprofen (ADVIL,MOTRIN) 200 MG tablet Take 200 mg by mouth as needed.        Marland Kitchen lisinopril (PRINIVIL,ZESTRIL) 10 MG tablet TAKE ONE TABLET BY MOUTH EVERY DAY  30 tablet  12  . multivitamin (THERAGRAN) per tablet Take 1 tablet by mouth daily.        . nitroGLYCERIN (NITROSTAT) 0.4 MG SL tablet Place 0.4 mg under the tongue every 5 (five) minutes as needed. May repeat times three.       Marland Kitchen PLAVIX 75 MG tablet TAKE ONE TABLET BY MOUTH EVERY DAY  30 each  12  . simvastatin (ZOCOR) 40 MG tablet TAKE ONE TABLET BY MOUTH AT BEDTIME  30 tablet  2     Past Medical History   Diagnosis Date  . Cerebrovascular disease   . Dyslipidemia   . Cardiomyopathy   . Hypertension     Benign  . TIA (transient ischemic attack)   . Coronary artery disease     No past surgical history on file.  History   Social History  . Marital Status: Married    Spouse Name: N/A    Number of Children: N/A  . Years of Education: N/A   Occupational History  . Sales    Social History Main Topics  . Smoking status: Former Games developer  . Smokeless tobacco: Not on file   Comment: Does not smoke.  Marland Kitchen Alcohol Use: 8.4 oz/week    14 Cans of beer per week  . Drug Use: Not on file  . Sexually Active: Not on file   Other Topics Concern  . Not on file   Social History Narrative   Lives in [place], [place].    ROS: no fevers or chills, productive cough, hemoptysis, dysphasia, odynophagia, melena, hematochezia, dysuria, hematuria, rash, seizure activity, orthopnea, PND, pedal edema, claudication. Remaining systems are negative.  Physical Exam: Well-developed well-nourished in no acute distress.  Skin is warm and dry.  HEENT is normal.  Neck is supple. No thyromegaly.  Chest is clear to auscultation with normal expansion.  Cardiovascular exam is regular rate and rhythm.  Abdominal exam nontender or distended. No masses palpated. Extremities show no edema. neuro grossly intact  ECG NSR with no ST changes

## 2011-02-05 ENCOUNTER — Other Ambulatory Visit: Payer: BC Managed Care – PPO | Admitting: *Deleted

## 2011-03-16 ENCOUNTER — Other Ambulatory Visit: Payer: Self-pay | Admitting: Cardiology

## 2011-03-16 MED ORDER — SIMVASTATIN 40 MG PO TABS: 40.0000 mg | ORAL_TABLET | Freq: Every day | ORAL | Status: DC

## 2011-10-03 ENCOUNTER — Encounter (HOSPITAL_COMMUNITY): Payer: Self-pay | Admitting: Emergency Medicine

## 2011-10-03 ENCOUNTER — Emergency Department (HOSPITAL_COMMUNITY)
Admission: EM | Admit: 2011-10-03 | Discharge: 2011-10-04 | Disposition: A | Discharge status: Home / Self Care | Payer: BC Managed Care – PPO | Attending: Emergency Medicine | Admitting: Emergency Medicine

## 2011-10-03 ENCOUNTER — Other Ambulatory Visit: Payer: Self-pay

## 2011-10-03 DIAGNOSIS — R209 Unspecified disturbances of skin sensation: Secondary | ICD-10-CM | POA: Insufficient documentation

## 2011-10-03 DIAGNOSIS — I10 Essential (primary) hypertension: Secondary | ICD-10-CM | POA: Insufficient documentation

## 2011-10-03 DIAGNOSIS — Z79899 Other long term (current) drug therapy: Secondary | ICD-10-CM | POA: Insufficient documentation

## 2011-10-03 DIAGNOSIS — I2510 Atherosclerotic heart disease of native coronary artery without angina pectoris: Secondary | ICD-10-CM | POA: Insufficient documentation

## 2011-10-03 DIAGNOSIS — R202 Paresthesia of skin: Secondary | ICD-10-CM

## 2011-10-03 LAB — BASIC METABOLIC PANEL
Chloride: 99 mEq/L (ref 96–112)
Creatinine, Ser: 0.92 mg/dL (ref 0.50–1.35)
GFR calc Af Amer: >90 mL/min (ref >90)
GFR calc non Af Amer: >90 mL/min (ref >90)

## 2011-10-03 LAB — CBC WITH DIFFERENTIAL/PLATELET
Basophils Absolute: 0 10*3/uL (ref 0.0–0.1)
Basophils Relative: 0 % (ref 0–1)
Eosinophils Absolute: 0.1 10*3/uL (ref 0.0–0.7)
HCT: 39.4 % (ref 39.0–52.0)
MCH: 33.7 pg (ref 26.0–34.0)
MCHC: 35 g/dL (ref 30.0–36.0)
Monocytes Absolute: 0.8 10*3/uL (ref 0.1–1.0)
Neutro Abs: 4.2 10*3/uL (ref 1.7–7.7)
Neutrophils Relative %: 57 % (ref 43–77)
RDW: 13.2 % (ref 11.5–15.5)

## 2011-10-03 LAB — TROPONIN I: Troponin I: <0.3 ng/mL (ref <0.30)

## 2011-10-03 NOTE — ED Notes (Signed)
PER EMS- Patient complaining L arm numbness, started approx 1.5 hours ago. ETOH on board. States his arm feels cold when he touches something. Two stents in the past. Alertx4 NAD. No there neuro deficits. Denies pain, SOB, n/v/d. CBG 117. 128/58, 80 bpm, 96% ra. Patient reports bad back, removed heavy object today, does not recall injuries.

## 2011-10-04 ENCOUNTER — Emergency Department (HOSPITAL_COMMUNITY): Payer: BC Managed Care – PPO

## 2011-10-04 NOTE — ED Provider Notes (Signed)
History     CSN: [id number]  Arrival date & time 10/03/11  2253   First MD Initiated Contact with Patient 10/03/11 2302      Chief Complaint  Patient presents with  . Numbness    (Consider location/radiation/quality/duration/timing/severity/associated sxs/prior treatment) HPI 61 year old male presents to emergency department with complaint of left arm paresthesias. Patient reports acute onset of symptoms around 9 PM tonight while he was lying in bed watching TV. Patient reports he lifted a 60-70 pound vice today. He denies any pain or numbness at time of lifting. Patient reports that from his shoulder down to his fingertips he has change in sensation with a "creepy crawly sensation", alteration of temp to temperature sensation, as he reports everything feels cold. He reports overall decreased sensation to the left arm. Patient is right-hand dominant. Patient has history of TIA 2 years ago, during that workup he had cardiac stent placed. Patient reports symptoms with this TIA were disequilibrium, and ataxia. Patient was on aspirin and Plavix, currently just on aspirin. Has history of hypertension. He was concerned about possible cardiac problems given the numbness in his arm. He denies any weakness, no difficulties with balance at this time. He denies any shortness of breath chest pain radiation of pain into neck or back. Patient reports that he will not have another MRI again ever as you was very uncomfortable with his last MRI. He denies any previous neck injury, or known bulging discs.  Past Medical History  Diagnosis Date  . Cerebrovascular disease   . Dyslipidemia   . Cardiomyopathy   . Hypertension     Benign  . TIA (transient ischemic attack)   . Coronary artery disease     History reviewed. No pertinent past surgical history.  Family History  Problem Relation Age of Onset  . Heart attack Mother   . Coronary artery disease Father     History  Substance Use Topics  .  Smoking status: Former Games developer  . Smokeless tobacco: Not on file   Comment: Does not smoke.  Marland Kitchen Alcohol Use: 8.4 oz/week    14 Cans of beer per week      Review of Systems  All other systems reviewed and are negative.    Allergies  Review of patient's allergies indicates no known allergies.  Home Medications   Current Outpatient Rx  Name Route Sig Dispense Refill  . ASPIRIN 325 MG PO TBEC Oral Take 325 mg by mouth daily.      Marland Kitchen CARVEDILOL 6.25 MG PO TABS Oral Take 6.25 mg by mouth 2 (two) times daily with a meal.    . IBUPROFEN 200 MG PO TABS Oral Take 400 mg by mouth every 6 (six) hours as needed. For back pain    . LISINOPRIL 10 MG PO TABS Oral Take 10 mg by mouth daily.    . ADULT MULTIVITAMIN W/MINERALS CH Oral Take 1 tablet by mouth daily.    Marland Kitchen NITROGLYCERIN 0.4 MG SL SUBL Sublingual Place 0.4 mg under the tongue every 5 (five) minutes as needed. For chest pain.  May repeat times three.    Marland Kitchen SIMVASTATIN 40 MG PO TABS Oral Take 1 tablet (40 mg total) by mouth at bedtime. 30 tablet 11    BP 114/53  Pulse 84  Temp 97.8 F (36.6 C) (Oral)  Resp 16  SpO2 96%  Physical Exam  Nursing note and vitals reviewed. Constitutional: He is oriented to person, place, and time. He appears well-developed and well-nourished. No  distress.  HENT:  Head: Normocephalic and atraumatic.  Right Ear: External ear normal.  Left Ear: External ear normal.  Nose: Nose normal.  Mouth/Throat: Oropharynx is clear and moist.  Eyes: Conjunctivae and EOM are normal. Pupils are equal, round, and reactive to light.  Neck: Normal range of motion. Neck supple. No JVD present. No tracheal deviation present. No thyromegaly present.  Cardiovascular: Normal rate, regular rhythm, normal heart sounds and intact distal pulses.  Exam reveals no gallop and no friction rub.   No murmur heard. Pulmonary/Chest: Effort normal and breath sounds normal. No stridor. No respiratory distress. He has no wheezes. He has no  rales. He exhibits no tenderness.  Abdominal: Soft. Bowel sounds are normal. He exhibits no distension and no mass. There is no tenderness. There is no rebound and no guarding.  Musculoskeletal: Normal range of motion. He exhibits no edema and no tenderness.  Lymphadenopathy:    He has no cervical adenopathy.  Neurological: He is alert and oriented to person, place, and time. He has normal reflexes. No cranial nerve deficit. He exhibits normal muscle tone. Coordination normal.       Patient with decreased sensation over entire left arm but normal strength movement  Skin: Skin is warm and dry. No rash noted. He is not diaphoretic. No erythema. No pallor.  Psychiatric: He has a normal mood and affect. His behavior is normal. Judgment and thought content normal.    ED Course  Procedures (including critical care time)  Labs Reviewed  CBC WITH DIFFERENTIAL - Abnormal; Notable for the following:    RBC 4.10 (*)     All other components within normal limits  BASIC METABOLIC PANEL - Abnormal; Notable for the following:    Potassium 3.4 (*)     Glucose, Bld 113 (*)     All other components within normal limits  ETHANOL - Abnormal; Notable for the following:    Alcohol, Ethyl (B) 145 (*)     All other components within normal limits  TROPONIN I   Dg Chest 2 View  10/04/2011  *RADIOLOGY REPORT*  Clinical Data: Left arm tingling.  Hypertension, coronary artery disease.  CHEST - 2 VIEW  Comparison: 09/10/2008  Findings: Lungs clear.  Heart size and pulmonary vascularity normal.  No effusion.  Visualized bones unremarkable.  IMPRESSION: No acute disease  Original Report Authenticated By: Osa Craver, M.D.     Date: 10/04/2011  Rate: 78  Rhythm: normal sinus rhythm  QRS Axis: left  Intervals: normal  ST/T Wave abnormalities: normal  Conduction Disutrbances:none  Narrative Interpretation: t wave flattening resolved  Old EKG Reviewed: changes noted    1. Paresthesia of left arm         MDM  61 year old male with history of coronary disease and TIA who presents with left arm paresthesias. Differential includes repeat TA/CVA, neuropathy secondary to bulging discs, or neuropathy to 2 other reason. I do not feel this is secondary to a cardiac cause. We'll get chest x-ray, baseline labs. Discuss with patient that I recommend an MRI, which he refused at out right. Will discuss with neurology for followup in the clinic and possible EMGs.      1:27 AM Discuss case with Dr. Roseanne Reno on call for neurology. He does not feel this is a stroke component, does not recommend any specific additional imaging or workup at this time. Chest x-ray, lab work does not show any ischemic indications. Will discharge home to followup with his primary care  doctor plus minus neurology  Olivia Mackie, MD 10/04/11 [phone]

## 2011-11-28 ENCOUNTER — Other Ambulatory Visit: Payer: Self-pay | Admitting: Cardiology

## 2011-11-29 ENCOUNTER — Other Ambulatory Visit: Payer: Self-pay | Admitting: *Deleted

## 2011-11-29 MED ORDER — LISINOPRIL 10 MG PO TABS: 10.0000 mg | ORAL_TABLET | Freq: Every day | ORAL | Status: DC

## 2011-11-29 NOTE — Telephone Encounter (Signed)
Refilled Lisinopril. 

## 2011-12-14 ENCOUNTER — Other Ambulatory Visit: Payer: Self-pay | Admitting: Cardiology

## 2012-01-13 ENCOUNTER — Other Ambulatory Visit: Payer: Self-pay | Admitting: Cardiology

## 2012-02-10 ENCOUNTER — Other Ambulatory Visit: Payer: Self-pay | Admitting: Cardiology

## 2012-02-22 ENCOUNTER — Other Ambulatory Visit: Payer: Self-pay | Admitting: Cardiology

## 2012-03-15 ENCOUNTER — Other Ambulatory Visit: Payer: Self-pay | Admitting: Cardiology

## 2012-03-30 ENCOUNTER — Other Ambulatory Visit: Payer: Self-pay | Admitting: Cardiology

## 2012-06-30 ENCOUNTER — Other Ambulatory Visit: Payer: Self-pay | Admitting: Cardiology

## 2012-08-01 ENCOUNTER — Other Ambulatory Visit: Payer: Self-pay | Admitting: Cardiology

## 2012-09-01 ENCOUNTER — Other Ambulatory Visit: Payer: Self-pay | Admitting: Cardiology

## 2012-09-01 NOTE — Telephone Encounter (Signed)
spoke with patient he will make an appt to obtain more refills. Dicussed the importance of seeing patient to make sure he's on the right medication, Pt states he does not like coming to DR. Because he dosnt want to pay for unnecessary test. Forward this call tho scheduling will only give patient enough refills until; appt

## 2012-09-19 ENCOUNTER — Encounter: Payer: Self-pay | Admitting: Nurse Practitioner

## 2012-09-19 ENCOUNTER — Ambulatory Visit (INDEPENDENT_AMBULATORY_CARE_PROVIDER_SITE_OTHER): Payer: BC Managed Care – PPO | Admitting: Nurse Practitioner

## 2012-09-19 VITALS — BP 150/70 | HR 72 | Ht 70.0 in | Wt 204.1 lb

## 2012-09-19 DIAGNOSIS — I255 Ischemic cardiomyopathy: Secondary | ICD-10-CM

## 2012-09-19 DIAGNOSIS — I2589 Other forms of chronic ischemic heart disease: Secondary | ICD-10-CM

## 2012-09-19 LAB — HEPATIC FUNCTION PANEL
ALT: 25 U/L (ref 0–53)
AST: 23 U/L (ref 0–37)
Albumin: 4.5 g/dL (ref 3.5–5.2)
Alkaline Phosphatase: 50 U/L (ref 39–117)
Bilirubin, Direct: 0 mg/dL (ref 0.0–0.3)
Total Bilirubin: 0.5 mg/dL (ref 0.3–1.2)
Total Protein: 7.3 g/dL (ref 6.0–8.3)

## 2012-09-19 LAB — BASIC METABOLIC PANEL
BUN: 9 mg/dL (ref 6–23)
CO2: 28 mEq/L (ref 19–32)
Calcium: 9.5 mg/dL (ref 8.4–10.5)
Chloride: 102 mEq/L (ref 96–112)
Creatinine, Ser: 0.9 mg/dL (ref 0.4–1.5)
GFR: 87.1 mL/min (ref >60.00)
Glucose, Bld: 92 mg/dL (ref 70–99)
Potassium: 3.8 mEq/L (ref 3.5–5.1)
Sodium: 138 mEq/L (ref 135–145)

## 2012-09-19 LAB — LIPID PANEL
Cholesterol: 124 mg/dL (ref 0–200)
HDL: 43.3 mg/dL (ref >39.00)
LDL Cholesterol: 55 mg/dL (ref 0–99)
Total CHOL/HDL Ratio: 3
Triglycerides: 129 mg/dL (ref 0.0–149.0)
VLDL: 25.8 mg/dL (ref 0.0–40.0)

## 2012-09-19 MED ORDER — LISINOPRIL 10 MG PO TABS: 10.0000 mg | ORAL_TABLET | Freq: Every day | ORAL | Status: DC

## 2012-09-19 MED ORDER — CARVEDILOL 12.5 MG PO TABS: 12.5000 mg | ORAL_TABLET | Freq: Two times a day (BID) | ORAL | Status: DC

## 2012-09-19 MED ORDER — SIMVASTATIN 40 MG PO TABS: 40.0000 mg | ORAL_TABLET | Freq: Every day | ORAL | Status: DC

## 2012-09-19 NOTE — Progress Notes (Signed)
[name] Date of Birth: [birth year] Medical Record #[id number]  History of Present Illness: Mr. Strothman is seen back today for a follow up visit. Seen for Dr. Jens Som. Has not been here since December of 2012. Has had prior TIA. Has had LV dysfunction and has had prior cardiac cath in 2010 showing 50% lesion near the origin of a septal, 50% 1st dx, 50 to 60% LAD at the takeoff of the 2nd DX and a 50 to 60% second DX. The ramus intermediate was occluded but filled via collaterals. The RCA has a 90% lesion and was stented with a DES. He has had prior MRA showing a 50% righ carotid stenosis and 55% right vertebral artery stenosis. Declined carotid doppler the last time he was seen. Echo back in 2011 showed an EF of 35 to 40%.   Plavix was stopped at his last visit in December of 2012.   Comes in today. Here alone. Says he is doing fine. No symptoms. Denies chest pain or shortness of breath. Not dizzy or lightheaded. No passing out spells. Has a high deductible and does not want any testing performed. Tries to restrict salt. Energy level ok. No CHF symptoms reported. Says he is "fine".    Current Outpatient Prescriptions  Medication Sig Dispense Refill  . aspirin 81 MG tablet Take 81 mg by mouth daily.      . carvedilol (COREG) 6.25 MG tablet Take 6.25 mg by mouth 2 (two) times daily with a meal.      . glucosamine-chondroitin 500-400 MG tablet Take 2 tablets by mouth daily.      Marland Kitchen ibuprofen (ADVIL,MOTRIN) 200 MG tablet Take 400 mg by mouth every 6 (six) hours as needed. For back pain      . lisinopril (PRINIVIL,ZESTRIL) 10 MG tablet TAKE ONE TABLET BY MOUTH EVERY DAY  30 tablet  11  . Multiple Vitamin (MULTIVITAMIN WITH MINERALS) TABS Take 1 tablet by mouth daily.      . nitroGLYCERIN (NITROSTAT) 0.4 MG SL tablet Place 0.4 mg under the tongue every 5 (five) minutes as needed. For chest pain.  May repeat times three.      . simvastatin (ZOCOR) 40 MG tablet TAKE ONE TABLET BY MOUTH AT  BEDTIME  30 tablet  10   No current facility-administered medications for this visit.    No Known Allergies  Past Medical History  Diagnosis Date  . Cerebrovascular disease   . Dyslipidemia   . Cardiomyopathy   . Hypertension     Benign  . TIA (transient ischemic attack)   . Coronary artery disease     History reviewed. No pertinent past surgical history.  History  Smoking status  . Former Smoker  Smokeless tobacco  . Not on file    Comment: Does not smoke.    History  Alcohol Use  . 8.4 oz/week  . 14 Cans of beer per week    Family History  Problem Relation Age of Onset  . Heart attack Mother   . Coronary artery disease Father     Review of Systems: The review of systems is per the HPI.  All other systems were reviewed and are negative.  Physical Exam: BP 150/70  Pulse 72  Ht 5\' 10"  (1.778 m)  Wt 204 lb 1.9 oz (92.588 kg)  BMI 29.29 kg/m2 Patient is pleasant and in no acute distress. Skin is warm and dry. Color is normal.  HEENT is unremarkable. Normocephalic/atraumatic. PERRL. Sclera are nonicteric. Neck is  supple. No masses. No JVD. Lungs are clear. Cardiac exam shows a regular rate and rhythm. Abdomen is soft. Extremities are without edema. Gait and ROM are intact. No gross neurologic deficits noted.  LABORATORY DATA: PENDING    Chemistry      Component Value Date/Time   NA 136 10/03/2011 2310   K 3.4* 10/03/2011 2310   CL 99 10/03/2011 2310   CO2 24 10/03/2011 2310   BUN 12 10/03/2011 2310   CREATININE 0.92 10/03/2011 2310      Component Value Date/Time   CALCIUM 9.1 10/03/2011 2310   ALKPHOS 55 01/23/2010 0911   AST 28 01/23/2010 0911   ALT 31 01/23/2010 0911   BILITOT 0.9 01/23/2010 0911     Lab Results  Component Value Date   CHOL 107 01/23/2010   HDL 33.00* 01/23/2010   LDLCALC 62 01/23/2010   TRIG 60.0 01/23/2010   CHOLHDL 3 01/23/2010   Echo Study Conclusions from December 2011  - Left ventricle: The cavity size was normal. Wall thickness  was normal. Systolic function was moderately reduced. The estimated ejection fraction was in the range of 35% to 40%. There is severe hypokinesis of the entire inferoposterior and lateral myocardium. Doppler parameters are consistent with abnormal left ventricular relaxation (grade 1 diastolic dysfunction). - Left atrium: The atrium was mildly dilated.   Assessment / Plan:  1. Ischemic CM - last EF was 35 to 40% from 2011 - he is not interested in further testing - I am increasing his BB today. Tried to explain to him the importance of being on target doses with his medicines. Also mentioned ICD to him as well. He will let us know if he wishes to have further testing performed.   2. HTN - BP by me is 170/70. Coreg is increased today. He says his BP is running in the 130's at home. I have asked him to continue to monitor and restrict his salt and NSAID use.   3. HLD - no recent labs. Will check today.   4. Carotid disease - past TIA - not interested in carotid duplex at this time.   I have refilled his medicines today. We will check labs. See him back preferably in 4 to 6 months but he has declined.   Patient is agreeable to this plan and will call if any problems develop in the interim.   Rosalio Macadamia, RN, ANP-C [facility] [address] [place], [place]  [address]

## 2012-09-19 NOTE — Patient Instructions (Addendum)
Stay on your current medicines except we are increasing the Coreg to 12.5 mg two times a day  I have refilled all your medicines  We will check labs today  Keep staying away from salt  Let us know if you would like to proceed with a doppler on your neck and on your heart  Call the [facility] office at [phone] if you have any questions, problems or concerns.

## 2013-10-17 ENCOUNTER — Other Ambulatory Visit: Payer: Self-pay | Admitting: Nurse Practitioner

## 2013-10-23 ENCOUNTER — Other Ambulatory Visit: Payer: Self-pay | Admitting: Nurse Practitioner

## 2013-11-01 ENCOUNTER — Other Ambulatory Visit: Payer: Self-pay | Admitting: Nurse Practitioner

## 2013-11-28 ENCOUNTER — Other Ambulatory Visit: Payer: Self-pay | Admitting: *Deleted

## 2013-11-28 MED ORDER — SIMVASTATIN 40 MG PO TABS: ORAL_TABLET | ORAL | Status: DC

## 2013-12-18 ENCOUNTER — Other Ambulatory Visit: Payer: Self-pay

## 2013-12-18 MED ORDER — CARVEDILOL 12.5 MG PO TABS: 12.5000 mg | ORAL_TABLET | Freq: Two times a day (BID) | ORAL | Status: DC

## 2013-12-18 MED ORDER — LISINOPRIL 10 MG PO TABS: ORAL_TABLET | ORAL | Status: DC

## 2013-12-18 MED ORDER — SIMVASTATIN 40 MG PO TABS: ORAL_TABLET | ORAL | Status: DC

## 2014-01-15 ENCOUNTER — Encounter: Payer: Self-pay | Admitting: Nurse Practitioner

## 2014-01-15 ENCOUNTER — Ambulatory Visit (INDEPENDENT_AMBULATORY_CARE_PROVIDER_SITE_OTHER): Payer: BC Managed Care – PPO | Admitting: Nurse Practitioner

## 2014-01-15 VITALS — BP 130/72 | HR 71 | Ht 70.0 in | Wt 208.1 lb

## 2014-01-15 DIAGNOSIS — I255 Ischemic cardiomyopathy: Secondary | ICD-10-CM

## 2014-01-15 DIAGNOSIS — I259 Chronic ischemic heart disease, unspecified: Secondary | ICD-10-CM

## 2014-01-15 DIAGNOSIS — I10 Essential (primary) hypertension: Secondary | ICD-10-CM

## 2014-01-15 DIAGNOSIS — I5022 Chronic systolic (congestive) heart failure: Secondary | ICD-10-CM

## 2014-01-15 DIAGNOSIS — I779 Disorder of arteries and arterioles, unspecified: Secondary | ICD-10-CM

## 2014-01-15 DIAGNOSIS — I739 Peripheral vascular disease, unspecified: Secondary | ICD-10-CM

## 2014-01-15 LAB — LIPID PANEL
Cholesterol: 129 mg/dL (ref 0–200)
HDL: 31.3 mg/dL — ABNORMAL LOW (ref >39.00)
LDL Cholesterol: 59 mg/dL (ref 0–99)
NonHDL: 97.7
Total CHOL/HDL Ratio: 4
Triglycerides: 192 mg/dL — ABNORMAL HIGH (ref 0.0–149.0)
VLDL: 38.4 mg/dL (ref 0.0–40.0)

## 2014-01-15 LAB — BASIC METABOLIC PANEL
BUN: 13 mg/dL (ref 6–23)
CO2: 28 mEq/L (ref 19–32)
Calcium: 9.2 mg/dL (ref 8.4–10.5)
Chloride: 100 mEq/L (ref 96–112)
Creatinine, Ser: 1 mg/dL (ref 0.4–1.5)
GFR: 84.64 mL/min (ref >60.00)
Glucose, Bld: 102 mg/dL — ABNORMAL HIGH (ref 70–99)
Potassium: 4.2 mEq/L (ref 3.5–5.1)
Sodium: 139 mEq/L (ref 135–145)

## 2014-01-15 LAB — HEPATIC FUNCTION PANEL
ALT: 23 U/L (ref 0–53)
AST: 19 U/L (ref 0–37)
Albumin: 4.5 g/dL (ref 3.5–5.2)
Alkaline Phosphatase: 56 U/L (ref 39–117)
Bilirubin, Direct: 0 mg/dL (ref 0.0–0.3)
Total Bilirubin: 0.4 mg/dL (ref 0.2–1.2)
Total Protein: 7.4 g/dL (ref 6.0–8.3)

## 2014-01-15 MED ORDER — LISINOPRIL 10 MG PO TABS: ORAL_TABLET | ORAL | Status: DC

## 2014-01-15 MED ORDER — CARVEDILOL 12.5 MG PO TABS: 12.5000 mg | ORAL_TABLET | Freq: Two times a day (BID) | ORAL | Status: DC

## 2014-01-15 MED ORDER — SIMVASTATIN 40 MG PO TABS: ORAL_TABLET | ORAL | Status: DC

## 2014-01-15 MED ORDER — NITROGLYCERIN 0.4 MG SL SUBL: 0.4000 mg | SUBLINGUAL_TABLET | SUBLINGUAL | Status: DC | PRN

## 2014-01-15 NOTE — Patient Instructions (Addendum)
We will be checking the following labs today BMET, Lipids, & HPF  Stay on your current medicines -  These were refilled.  We will check an EKG today  See Dr. Jens Somrenshaw in 6 months  If you decide to have your tests for your heart and the blockages in your carotids updated - let us know   Call the [facility] office at [phone] if you have any questions, problems or concerns.

## 2014-01-15 NOTE — Progress Notes (Signed)
Adam CapesLeonard L Andrews Date of Birth: 18-Feb-1951 Medical Record #[id number]  History of Present Illness: [name] is seen back today for a follow up visit. Seen for Dr. Jens Somrenshaw. Has not been here since July of 2014. He has had prior TIA, CAD with prior PCI, ICM and carotid disease. Has had LV dysfunction and has had prior cardiac cath in 2010 showing 50% lesion near the origin of a septal, 50% 1st dx, 50 to 60% LAD at the takeoff of the 2nd DX and a 50 to 60% second DX. The ramus intermediate was occluded but filled via collaterals. The RCA has a 90% lesion and was stented with a DES. He has had prior MRA showing a 50% righ carotid stenosis and 55% right vertebral artery stenosis. Had declined carotid doppler in the past. His last Echo was back in 2011 showed an EF of 35 to 40%.   Plavix was stopped at his last visit in December of 2012.   I last saw him in July of 2014. Was doing ok - had a high deductible and would not allow any testing to be scheduled/performed despite my recommendation. BP was elevated and I increase his Coreg. He declined all further testing - said he "was fine".   Comes in today. Here alone. Says he is doing fine. He does report that if he sleeps poorly he will then feel tired the next day. No neuro symptoms. Denies chest pain or shortness of breath. Not dizzy or lightheaded. No passing out spell. Tries to restrict salt. Energy level ok. No CHF symptoms reported. Says he is "fine". Says he has a $3500 deductible and is not willing to spend the money - especially since he feels fine.    Current Outpatient Prescriptions  Medication Sig Dispense Refill  . aspirin 81 MG tablet Take 81 mg by mouth daily.    . carvedilol (COREG) 12.5 MG tablet Take 1 tablet (12.5 mg total) by mouth 2 (two) times daily with a meal. 60 tablet 11  . ibuprofen (ADVIL,MOTRIN) 200 MG tablet Take 400 mg by mouth every 6 (six) hours as needed. For back pain    . lisinopril (PRINIVIL,ZESTRIL) 10 MG  tablet TAKE ONE TABLET BY MOUTH ONCE DAILY 30 tablet 11  . Multiple Vitamin (MULTIVITAMIN WITH MINERALS) TABS Take 1 tablet by mouth daily.    . nitroGLYCERIN (NITROSTAT) 0.4 MG SL tablet Place 1 tablet (0.4 mg total) under the tongue every 5 (five) minutes as needed. For chest pain.  May repeat times three. 25 tablet 3  . simvastatin (ZOCOR) 40 MG tablet TAKE ONE TABLET BY MOUTH AT BEDTIME 30 tablet 11  . glucosamine-chondroitin 500-400 MG tablet Take 2 tablets by mouth daily.     No current facility-administered medications for this visit.    No Known Allergies  Past Medical History  Diagnosis Date  . Cerebrovascular disease   . Dyslipidemia   . Cardiomyopathy   . Hypertension     Benign  . TIA (transient ischemic attack)   . Coronary artery disease     History reviewed. No pertinent past surgical history.  History  Smoking status  . Former Smoker  Smokeless tobacco  . Not on file    Comment: Does not smoke.    History  Alcohol Use  . 8.4 oz/week  . 14 Cans of beer per week    Family History  Problem Relation Age of Onset  . Heart attack Mother   . Coronary artery disease Father  Review of Systems: The review of systems is per the HPI.  All other systems were reviewed and are negative.  Physical Exam: BP 130/72 mmHg  Pulse 71  Ht 5\' 10"  (1.778 m)  Wt 208 lb 1.9 oz (94.403 kg)  BMI 29.86 kg/m2  SpO2 95%  BP by me is 140/90.  Patient is alert and in no acute distress. Skin is warm and dry. Color is normal.  HEENT is unremarkable but has a right carotid bruit. Normocephalic/atraumatic. PERRL. Sclera are nonicteric. Neck is supple. No masses. No JVD. Lungs are clear. Cardiac exam shows a regular rate and rhythm. Abdomen is soft. Extremities are without edema. Gait and ROM are intact. No gross neurologic deficits noted.  Wt Readings from Last 3 Encounters:  01/15/14 208 lb 1.9 oz (94.403 kg)  09/19/12 204 lb 1.9 oz (92.588 kg)  01/29/11 205 lb (92.987 kg)      LABORATORY DATA/PROCEDURES: EKG today with sinus rhythm.    Lab Results  Component Value Date   WBC 7.4 10/03/2011   HGB 13.8 10/03/2011   HCT 39.4 10/03/2011   PLT 205 10/03/2011   GLUCOSE 92 09/19/2012   CHOL 124 09/19/2012   TRIG 129.0 09/19/2012   HDL 43.30 09/19/2012   LDLCALC 55 09/19/2012   ALT 25 09/19/2012   AST 23 09/19/2012   NA 138 09/19/2012   K 3.8 09/19/2012   CL 102 09/19/2012   CREATININE 0.9 09/19/2012   BUN 9 09/19/2012   CO2 28 09/19/2012   TSH 1.970 Test methodology is 3rd generation TSH 09/19/2008   INR 1.0 ratio 09/26/2008   HGBA1C  09/19/2008    6.0 (NOTE) The ADA recommends the following therapeutic goal for glycemic control related to Hgb A1c measurement: Goal of therapy: <6.5 Hgb A1c  Reference: American Diabetes Association: Clinical Practice Recommendations 2010, Diabetes Care, 2010, 33: (Suppl  1).    BNP (last 3 results) No results for input(s): PROBNP in the last 8760 hours.  Echo Study Conclusions from December of 2011  - Left ventricle: The cavity size was normal. Wall thickness was  normal. Systolic function was moderately reduced. The estimated  ejection fraction was in the range of 35% to 40%. There is severe  hypokinesis of the entire inferoposterior and lateral myocardium.  Doppler parameters are consistent with abnormal left ventricular  relaxation (grade 1 diastolic dysfunction). - Left atrium: The atrium was mildly dilated.   Assessment / Plan:  1. Ischemic CM - last EF was 35 to 40% from 2011 - he continues to not be interested in further testing. I have once again explained to him the importance of being on target doses with his medicines. Also mentioned ICD to him as well. He does not wish to proceed with any further testing.  2. HTN - BP by me is 140/80. His readings form home are ok. Meds refilled today.   3. HLD - no recent labs. Will check today.   4. Carotid disease - past TIA - not interested in  carotid duplex at this time. Has bruit on the R noted on exam today.   5. CAD - prior PCI - residual disease - no symptoms reported and not interested in further procedures.   I would like for him to see Dr. Jens Som in 6 months. I do not feel I really have anything to offer.   Patient is agreeable to this plan and will call if any problems develop in the interim.   Rosalio Macadamia, RN,  [facility] [address] [place], [place]  [id number] [phone]

## 2014-11-25 ENCOUNTER — Other Ambulatory Visit: Payer: Self-pay | Admitting: Cardiology

## 2014-11-25 MED ORDER — SIMVASTATIN 40 MG PO TABS: ORAL_TABLET | ORAL | Status: DC

## 2014-11-29 ENCOUNTER — Other Ambulatory Visit: Payer: Self-pay

## 2014-11-29 MED ORDER — CARVEDILOL 12.5 MG PO TABS: 12.5000 mg | ORAL_TABLET | Freq: Two times a day (BID) | ORAL | Status: DC

## 2014-12-31 ENCOUNTER — Other Ambulatory Visit: Payer: Self-pay | Admitting: Nurse Practitioner

## 2015-01-31 ENCOUNTER — Other Ambulatory Visit: Payer: Self-pay | Admitting: Cardiology

## 2015-01-31 ENCOUNTER — Other Ambulatory Visit: Payer: Self-pay | Admitting: Nurse Practitioner

## 2015-01-31 NOTE — Telephone Encounter (Signed)
REFILL 

## 2015-02-13 ENCOUNTER — Telehealth: Payer: Self-pay | Admitting: Cardiology

## 2015-02-13 MED ORDER — LISINOPRIL 10 MG PO TABS: ORAL_TABLET | ORAL | Status: DC

## 2015-02-13 NOTE — Telephone Encounter (Signed)
°*  STAT* If patient is at the pharmacy, call can be transferred to refill team.   1. Which medications need to be refilled? (please list name of each medication and dose if known) Lisinopril   2. Which pharmacy/location (including street and city if local pharmacy) is medication to be sent to? [facility] on [address]   3. Do they need a 30 day or 90 day supply? 90

## 2015-02-13 NOTE — Telephone Encounter (Signed)
Refill sent electronically

## 2015-02-26 NOTE — Progress Notes (Signed)
HPI: FU CAD. Cardiac catheterization on August 2010 revealed a 50% lesion near the origin of a septal, a 50% first diagonal, a 50-60% LAD at the takeoff of the second diagonal and a 50 to 60% second diagonal. The ramus intermedius was occluded but filled via collaterals. The right coronary artery had a 90% lesion. The patient had a drug-eluting stent to the right coronary artery. Carotid dopplers in Dec 2011 showed a 40-59 right and a 0-39 left and fu recommended in one year. Echo in Dec 2011 showed EF 35-40, inferoposterior and lateral HK, mild LAE. Since last seen, the patient denies any dyspnea on exertion, orthopnea, PND, pedal edema, palpitations, syncope or chest pain.   Current Outpatient Prescriptions  Medication Sig Dispense Refill  . aspirin 81 MG tablet Take 81 mg by mouth daily.    . carvedilol (COREG) 12.5 MG tablet Take 1 tablet (12.5 mg total) by mouth 2 (two) times daily with a meal. 60 tablet 1  . Coenzyme Q10 (CO Q 10) 10 MG CAPS Take 1 capsule by mouth daily.    Marland Kitchen. glucosamine-chondroitin 500-400 MG tablet Take 2 tablets by mouth daily.    Marland Kitchen. ibuprofen (ADVIL,MOTRIN) 200 MG tablet Take 400 mg by mouth every 6 (six) hours as needed. For back pain    . lisinopril (PRINIVIL,ZESTRIL) 10 MG tablet TAKE ONE TABLET BY MOUTH ONCE DAILY. 90 tablet 0  . Multiple Vitamin (MULTIVITAMIN WITH MINERALS) TABS Take 1 tablet by mouth daily.    . nitroGLYCERIN (NITROSTAT) 0.4 MG SL tablet Place 1 tablet (0.4 mg total) under the tongue every 5 (five) minutes as needed. For chest pain.  May repeat times three. 25 tablet 3  . simvastatin (ZOCOR) 40 MG tablet Take 1 tablet (40 mg total) by mouth at bedtime. NEED OV. 30 tablet 0  . tamsulosin (FLOMAX) 0.4 MG CAPS capsule Take 1 capsule by mouth daily. Take 1 tab daily  4   No current facility-administered medications for this visit.     Past Medical History  Diagnosis Date  . Cerebrovascular disease   . Dyslipidemia   . Cardiomyopathy     . Hypertension     Benign  . TIA (transient ischemic attack)   . Coronary artery disease     History reviewed. No pertinent past surgical history.  Social History   Social History  . Marital Status: Married    Spouse Name: N/A  . Number of Children: N/A  . Years of Education: N/A   Occupational History  . Sales    Social History Main Topics  . Smoking status: Former Games developermoker  . Smokeless tobacco: Not on file     Comment: Does not smoke.  Marland Kitchen. Alcohol Use: 8.4 oz/week    14 Cans of beer per week  . Drug Use: No  . Sexual Activity: Not on file   Other Topics Concern  . Not on file   Social History Narrative   [place]    ROS: no fevers or chills, productive cough, hemoptysis, dysphasia, odynophagia, melena, hematochezia, dysuria, hematuria, rash, seizure activity, orthopnea, PND, pedal edema, claudication. Remaining systems are negative.  Physical Exam: Well-developed well-nourished in no acute distress.  Skin is warm and dry.  HEENT is normal.  Neck is supple.  Chest is clear to auscultation with normal expansion.  Cardiovascular exam is regular rate and rhythm.  Abdominal exam nontender or distended. No masses palpated. Extremities show no edema. neuro grossly intact  ECG  Sinus rhythm at a rate of 68. No ST changes.

## 2015-02-28 ENCOUNTER — Ambulatory Visit (INDEPENDENT_AMBULATORY_CARE_PROVIDER_SITE_OTHER): Payer: BLUE CROSS/BLUE SHIELD | Admitting: Cardiology

## 2015-02-28 ENCOUNTER — Encounter: Payer: Self-pay | Admitting: Cardiology

## 2015-02-28 VITALS — BP 132/80 | HR 68 | Ht 70.0 in | Wt 212.3 lb

## 2015-02-28 DIAGNOSIS — I10 Essential (primary) hypertension: Secondary | ICD-10-CM

## 2015-02-28 DIAGNOSIS — I2510 Atherosclerotic heart disease of native coronary artery without angina pectoris: Secondary | ICD-10-CM

## 2015-02-28 NOTE — Assessment & Plan Note (Signed)
Continue ACE inhibitor and beta blocker.I recommended an echo today but he is concerned about the cost.

## 2015-02-28 NOTE — Patient Instructions (Signed)
Medication Instructions:   NO CHANGE  Testing/Procedures:  CAROTID DOPPLERS EXERCISE TREADMILL TEST ECHOCARDIOGRAM  Follow-Up:  Your physician wants you to follow-up in: ONE YEAR WITH DR Shelda PalRENSHAW You will receive a reminder letter in the mail two months in advance. If you don't receive a letter, please call our office to schedule the follow-up appointment.   If you need a refill on your cardiac medications before your next appointment, please call your pharmacy.

## 2015-02-28 NOTE — Assessment & Plan Note (Signed)
Continue statin. Lipids and liver monitored by primary care. 

## 2015-02-28 NOTE — Assessment & Plan Note (Signed)
Blood pressure controlled. Continue present medications. 

## 2015-02-28 NOTE — Assessment & Plan Note (Signed)
Continue aspirin and statin. We discussed the treadmill today. However he is concerned about the cost. He will check with his insurance company to see how much is covered. He will call us if he is agreeable. The same will be for carotid Dopplers and echocardiogram which were also recommended today.

## 2015-02-28 NOTE — Assessment & Plan Note (Signed)
Continue aspirin and statin. I recommended follow-up carotid Dopplers today but he declined. He will check on the cost and contact us if agreeable.

## 2015-03-04 ENCOUNTER — Other Ambulatory Visit: Payer: Self-pay | Admitting: Cardiology

## 2015-03-04 NOTE — Telephone Encounter (Signed)
Rx request sent to pharmacy.  

## 2015-04-02 ENCOUNTER — Other Ambulatory Visit: Payer: Self-pay | Admitting: Nurse Practitioner

## 2015-05-16 ENCOUNTER — Other Ambulatory Visit: Payer: Self-pay | Admitting: Cardiology

## 2015-05-16 NOTE — Telephone Encounter (Signed)
Rx request sent to pharmacy.  

## 2016-03-05 ENCOUNTER — Other Ambulatory Visit: Payer: Self-pay | Admitting: Cardiology

## 2016-04-14 MED ORDER — HEPARIN SODIUM (PORCINE) 1000 UNIT/ML IJ SOLN
INTRAMUSCULAR | Status: AC
  Filled 2016-04-14: qty 1

## 2016-04-14 MED ORDER — IOPAMIDOL (ISOVUE-370) INJECTION 76%
INTRAVENOUS | Status: AC
  Filled 2016-04-14: qty 125

## 2016-04-14 MED ORDER — TICAGRELOR 90 MG PO TABS
ORAL_TABLET | ORAL | Status: AC
  Filled 2016-04-14: qty 2

## 2016-04-14 MED ORDER — HYDROMORPHONE HCL 1 MG/ML IJ SOLN
INTRAMUSCULAR | Status: AC
  Filled 2016-04-14: qty 1

## 2016-04-14 MED ORDER — NITROGLYCERIN 1 MG/10 ML FOR IR/CATH LAB
INTRA_ARTERIAL | Status: AC
  Filled 2016-04-14: qty 10

## 2016-04-14 MED ORDER — VERAPAMIL HCL 2.5 MG/ML IV SOLN
INTRAVENOUS | Status: AC
  Filled 2016-04-14: qty 2

## 2016-04-14 MED ORDER — HEPARIN (PORCINE) IN NACL 2-0.9 UNIT/ML-% IJ SOLN
INTRAMUSCULAR | Status: AC
  Filled 2016-04-14: qty 1000

## 2016-04-14 MED ORDER — LIDOCAINE HCL (PF) 1 % IJ SOLN
INTRAMUSCULAR | Status: AC
  Filled 2016-04-14: qty 30

## 2016-04-14 MED ORDER — TIROFIBAN HCL IN NACL 5-0.9 MG/100ML-% IV SOLN
INTRAVENOUS | Status: AC
  Filled 2016-04-14: qty 100

## 2016-04-15 ENCOUNTER — Encounter (HOSPITAL_COMMUNITY)
Admission: EM | Disposition: A | Discharge status: Home / Self Care | Payer: Self-pay | Source: Home / Self Care | Attending: Interventional Cardiology

## 2016-04-15 ENCOUNTER — Encounter (HOSPITAL_COMMUNITY): Payer: Self-pay | Admitting: Emergency Medicine

## 2016-04-15 ENCOUNTER — Inpatient Hospital Stay (HOSPITAL_COMMUNITY): Payer: BLUE CROSS/BLUE SHIELD

## 2016-04-15 ENCOUNTER — Inpatient Hospital Stay (HOSPITAL_COMMUNITY)
Admission: EM | Admit: 2016-04-15 | Discharge: 2016-04-17 | DRG: 247 | Disposition: A | Discharge status: Home / Self Care | Payer: BLUE CROSS/BLUE SHIELD | Attending: Interventional Cardiology | Admitting: Interventional Cardiology

## 2016-04-15 DIAGNOSIS — Z8249 Family history of ischemic heart disease and other diseases of the circulatory system: Secondary | ICD-10-CM | POA: Diagnosis not present

## 2016-04-15 DIAGNOSIS — Z955 Presence of coronary angioplasty implant and graft: Secondary | ICD-10-CM | POA: Diagnosis not present

## 2016-04-15 DIAGNOSIS — I252 Old myocardial infarction: Secondary | ICD-10-CM

## 2016-04-15 DIAGNOSIS — Z95820 Peripheral vascular angioplasty status with implants and grafts: Secondary | ICD-10-CM

## 2016-04-15 DIAGNOSIS — I110 Hypertensive heart disease with heart failure: Secondary | ICD-10-CM | POA: Diagnosis present

## 2016-04-15 DIAGNOSIS — E7800 Pure hypercholesterolemia, unspecified: Secondary | ICD-10-CM | POA: Diagnosis not present

## 2016-04-15 DIAGNOSIS — I214 Non-ST elevation (NSTEMI) myocardial infarction: Secondary | ICD-10-CM | POA: Diagnosis not present

## 2016-04-15 DIAGNOSIS — Z4659 Encounter for fitting and adjustment of other gastrointestinal appliance and device: Secondary | ICD-10-CM

## 2016-04-15 DIAGNOSIS — I2510 Atherosclerotic heart disease of native coronary artery without angina pectoris: Secondary | ICD-10-CM | POA: Diagnosis present

## 2016-04-15 DIAGNOSIS — I25110 Atherosclerotic heart disease of native coronary artery with unstable angina pectoris: Secondary | ICD-10-CM | POA: Diagnosis not present

## 2016-04-15 DIAGNOSIS — I472 Ventricular tachycardia: Secondary | ICD-10-CM | POA: Diagnosis not present

## 2016-04-15 DIAGNOSIS — Z87891 Personal history of nicotine dependence: Secondary | ICD-10-CM | POA: Diagnosis not present

## 2016-04-15 DIAGNOSIS — E876 Hypokalemia: Secondary | ICD-10-CM | POA: Diagnosis present

## 2016-04-15 DIAGNOSIS — Z7982 Long term (current) use of aspirin: Secondary | ICD-10-CM

## 2016-04-15 DIAGNOSIS — I2102 ST elevation (STEMI) myocardial infarction involving left anterior descending coronary artery: Principal | ICD-10-CM

## 2016-04-15 DIAGNOSIS — I5022 Chronic systolic (congestive) heart failure: Secondary | ICD-10-CM | POA: Diagnosis present

## 2016-04-15 DIAGNOSIS — I213 ST elevation (STEMI) myocardial infarction of unspecified site: Secondary | ICD-10-CM

## 2016-04-15 DIAGNOSIS — I2109 ST elevation (STEMI) myocardial infarction involving other coronary artery of anterior wall: Secondary | ICD-10-CM | POA: Diagnosis not present

## 2016-04-15 DIAGNOSIS — I679 Cerebrovascular disease, unspecified: Secondary | ICD-10-CM | POA: Diagnosis present

## 2016-04-15 DIAGNOSIS — E782 Mixed hyperlipidemia: Secondary | ICD-10-CM | POA: Diagnosis not present

## 2016-04-15 DIAGNOSIS — I429 Cardiomyopathy, unspecified: Secondary | ICD-10-CM | POA: Diagnosis present

## 2016-04-15 DIAGNOSIS — E785 Hyperlipidemia, unspecified: Secondary | ICD-10-CM | POA: Diagnosis present

## 2016-04-15 DIAGNOSIS — R9431 Abnormal electrocardiogram [ECG] [EKG]: Secondary | ICD-10-CM | POA: Diagnosis not present

## 2016-04-15 DIAGNOSIS — R789 Finding of unspecified substance, not normally found in blood: Secondary | ICD-10-CM

## 2016-04-15 HISTORY — DX: Peripheral vascular angioplasty status with implants and grafts: Z95.820

## 2016-04-15 HISTORY — PX: CORONARY STENT INTERVENTION: CATH118234

## 2016-04-15 HISTORY — PX: LEFT HEART CATH AND CORONARY ANGIOGRAPHY: CATH118249

## 2016-04-15 LAB — LIPID PANEL
CHOLESTEROL: 101 mg/dL (ref 0–200)
HDL: 32 mg/dL — AB (ref >40)
LDL CALC: 60 mg/dL (ref 0–99)
TRIGLYCERIDES: 43 mg/dL (ref <150)
Total CHOL/HDL Ratio: 3.2 RATIO
VLDL: 9 mg/dL (ref 0–40)

## 2016-04-15 LAB — POCT I-STAT, CHEM 8
BUN: 12 mg/dL (ref 6–20)
CALCIUM ION: 1.27 mmol/L (ref 1.15–1.40)
CHLORIDE: 104 mmol/L (ref 101–111)
CREATININE: 1 mg/dL (ref 0.61–1.24)
GLUCOSE: 182 mg/dL — AB (ref 65–99)
HCT: 36 % — ABNORMAL LOW (ref 39.0–52.0)
Hemoglobin: 12.2 g/dL — ABNORMAL LOW (ref 13.0–17.0)
POTASSIUM: 3.6 mmol/L (ref 3.5–5.1)
Sodium: 140 mmol/L (ref 135–145)
TCO2: 26 mmol/L (ref 0–100)

## 2016-04-15 LAB — COMPREHENSIVE METABOLIC PANEL
ALK PHOS: 39 U/L (ref 38–126)
ALT: 37 U/L (ref 17–63)
ANION GAP: 9 (ref 5–15)
AST: 39 U/L (ref 15–41)
Albumin: 4 g/dL (ref 3.5–5.0)
BILIRUBIN TOTAL: 0.6 mg/dL (ref 0.3–1.2)
BUN: 11 mg/dL (ref 6–20)
CALCIUM: 9 mg/dL (ref 8.9–10.3)
CO2: 25 mmol/L (ref 22–32)
CREATININE: 0.89 mg/dL (ref 0.61–1.24)
Chloride: 105 mmol/L (ref 101–111)
Glucose, Bld: 169 mg/dL — ABNORMAL HIGH (ref 65–99)
Potassium: 3.7 mmol/L (ref 3.5–5.1)
Sodium: 139 mmol/L (ref 135–145)
TOTAL PROTEIN: 6.2 g/dL — AB (ref 6.5–8.1)

## 2016-04-15 LAB — CBC WITH DIFFERENTIAL/PLATELET
BASOS ABS: 0 10*3/uL (ref 0.0–0.1)
BASOS PCT: 0 %
EOS ABS: 0 10*3/uL (ref 0.0–0.7)
Eosinophils Relative: 0 %
HEMATOCRIT: 37.2 % — AB (ref 39.0–52.0)
Hemoglobin: 13 g/dL (ref 13.0–17.0)
Lymphocytes Relative: 9 %
Lymphs Abs: 1 10*3/uL (ref 0.7–4.0)
MCH: 34.5 pg — ABNORMAL HIGH (ref 26.0–34.0)
MCHC: 34.9 g/dL (ref 30.0–36.0)
MCV: 98.7 fL (ref 78.0–100.0)
Monocytes Absolute: 0.4 10*3/uL (ref 0.1–1.0)
Monocytes Relative: 3 %
NEUTROS ABS: 10.3 10*3/uL — AB (ref 1.7–7.7)
NEUTROS PCT: 88 %
Platelets: 179 10*3/uL (ref 150–400)
RBC: 3.77 MIL/uL — ABNORMAL LOW (ref 4.22–5.81)
RDW: 13.2 % (ref 11.5–15.5)
WBC: 11.7 10*3/uL — ABNORMAL HIGH (ref 4.0–10.5)

## 2016-04-15 LAB — ECHOCARDIOGRAM COMPLETE
Height: 70 in
Weight: 3355.2 oz

## 2016-04-15 LAB — POCT ACTIVATED CLOTTING TIME
Activated Clotting Time: 224 seconds
Activated Clotting Time: 241 seconds

## 2016-04-15 LAB — PROTIME-INR
INR: 1.16
Prothrombin Time: 14.9 seconds (ref 11.4–15.2)

## 2016-04-15 LAB — HIV ANTIBODY (ROUTINE TESTING W REFLEX): HIV SCREEN 4TH GENERATION: NONREACTIVE

## 2016-04-15 LAB — MRSA PCR SCREENING: MRSA by PCR: NEGATIVE

## 2016-04-15 SURGERY — LEFT HEART CATH AND CORONARY ANGIOGRAPHY
Anesthesia: LOCAL

## 2016-04-15 MED ORDER — ONDANSETRON HCL 4 MG/2ML IJ SOLN: 4.0000 mg | Freq: Four times a day (QID) | INTRAMUSCULAR | Status: DC | PRN

## 2016-04-15 MED ORDER — LABETALOL HCL 5 MG/ML IV SOLN: 10.0000 mg | INTRAVENOUS | Status: AC | PRN

## 2016-04-15 MED ORDER — LIDOCAINE HCL (PF) 1 % IJ SOLN
INTRAMUSCULAR | Status: DC | PRN
  Administered 2016-04-15: 2 mL

## 2016-04-15 MED ORDER — NITROGLYCERIN 0.4 MG SL SUBL: 0.4000 mg | SUBLINGUAL_TABLET | SUBLINGUAL | Status: DC | PRN

## 2016-04-15 MED ORDER — HYDROMORPHONE HCL 1 MG/ML IJ SOLN
INTRAMUSCULAR | Status: DC | PRN
  Administered 2016-04-15: 1 mg via INTRAVENOUS

## 2016-04-15 MED ORDER — ASPIRIN EC 81 MG PO TBEC: 81.0000 mg | DELAYED_RELEASE_TABLET | Freq: Every day | ORAL | Status: DC

## 2016-04-15 MED ORDER — LISINOPRIL 10 MG PO TABS
10.0000 mg | ORAL_TABLET | Freq: Every day | ORAL | Status: DC
  Administered 2016-04-17: 10 mg via ORAL
  Filled 2016-04-15 (×2): qty 1

## 2016-04-15 MED ORDER — SODIUM CHLORIDE 0.9% FLUSH
3.0000 mL | Freq: Two times a day (BID) | INTRAVENOUS | Status: DC
  Administered 2016-04-15 – 2016-04-16 (×3): 3 mL via INTRAVENOUS

## 2016-04-15 MED ORDER — ASPIRIN 81 MG PO TABS: 81.0000 mg | ORAL_TABLET | Freq: Every day | ORAL | Status: DC

## 2016-04-15 MED ORDER — TIROFIBAN (AGGRASTAT) BOLUS VIA INFUSION
INTRAVENOUS | Status: DC | PRN
  Administered 2016-04-15: 2400 ug via INTRAVENOUS

## 2016-04-15 MED ORDER — LISINOPRIL 10 MG PO TABS: 10.0000 mg | ORAL_TABLET | Freq: Every day | ORAL | Status: DC

## 2016-04-15 MED ORDER — ATORVASTATIN CALCIUM 80 MG PO TABS
80.0000 mg | ORAL_TABLET | Freq: Every day | ORAL | Status: DC
  Administered 2016-04-15 – 2016-04-16 (×2): 80 mg via ORAL
  Filled 2016-04-15 (×2): qty 1

## 2016-04-15 MED ORDER — CARVEDILOL 12.5 MG PO TABS
12.5000 mg | ORAL_TABLET | Freq: Two times a day (BID) | ORAL | Status: DC
  Administered 2016-04-15 – 2016-04-17 (×5): 12.5 mg via ORAL
  Filled 2016-04-15 (×5): qty 1

## 2016-04-15 MED ORDER — HEPARIN (PORCINE) IN NACL 2-0.9 UNIT/ML-% IJ SOLN
INTRAMUSCULAR | Status: DC | PRN
  Administered 2016-04-15: 1000 mL

## 2016-04-15 MED ORDER — TICAGRELOR 90 MG PO TABS: 90.0000 mg | ORAL_TABLET | Freq: Two times a day (BID) | ORAL | Status: DC

## 2016-04-15 MED ORDER — TAMSULOSIN HCL 0.4 MG PO CAPS
0.4000 mg | ORAL_CAPSULE | Freq: Every day | ORAL | Status: DC
  Administered 2016-04-15 – 2016-04-17 (×3): 0.4 mg via ORAL
  Filled 2016-04-15 (×3): qty 1

## 2016-04-15 MED ORDER — TIROFIBAN HCL IN NACL 5-0.9 MG/100ML-% IV SOLN
INTRAVENOUS | Status: DC | PRN
  Administered 2016-04-15: 0.15 ug/kg/min via INTRAVENOUS

## 2016-04-15 MED ORDER — TIROFIBAN HCL IN NACL 5-0.9 MG/100ML-% IV SOLN: 0.1500 ug/kg/min | INTRAVENOUS | Status: AC

## 2016-04-15 MED ORDER — ZOLPIDEM TARTRATE 5 MG PO TABS
5.0000 mg | ORAL_TABLET | Freq: Every evening | ORAL | Status: DC | PRN
  Administered 2016-04-15 – 2016-04-16 (×2): 5 mg via ORAL
  Filled 2016-04-15 (×2): qty 1

## 2016-04-15 MED ORDER — VERAPAMIL HCL 2.5 MG/ML IV SOLN
INTRAVENOUS | Status: DC | PRN
  Administered 2016-04-15: 10 mL via INTRA_ARTERIAL

## 2016-04-15 MED ORDER — HEPARIN SODIUM (PORCINE) 1000 UNIT/ML IJ SOLN
INTRAMUSCULAR | Status: DC | PRN
  Administered 2016-04-15: 5000 [IU] via INTRAVENOUS
  Administered 2016-04-15: 3000 [IU] via INTRAVENOUS

## 2016-04-15 MED ORDER — ASPIRIN 81 MG PO CHEW
81.0000 mg | CHEWABLE_TABLET | Freq: Every day | ORAL | Status: DC
  Administered 2016-04-15 – 2016-04-17 (×3): 81 mg via ORAL
  Filled 2016-04-15 (×3): qty 1

## 2016-04-15 MED ORDER — HEPARIN SODIUM (PORCINE) 5000 UNIT/ML IJ SOLN
5000.0000 [IU] | Freq: Three times a day (TID) | INTRAMUSCULAR | Status: DC
  Administered 2016-04-15 – 2016-04-17 (×7): 5000 [IU] via SUBCUTANEOUS
  Filled 2016-04-15 (×7): qty 1

## 2016-04-15 MED ORDER — LISINOPRIL 5 MG PO TABS: 5.0000 mg | ORAL_TABLET | Freq: Every day | ORAL | Status: DC

## 2016-04-15 MED ORDER — SODIUM CHLORIDE 0.9% FLUSH: 3.0000 mL | INTRAVENOUS | Status: DC | PRN

## 2016-04-15 MED ORDER — TAMSULOSIN HCL 0.4 MG PO CAPS
0.4000 mg | ORAL_CAPSULE | Freq: Every day | ORAL | Status: DC
Start: 2016-04-15 — End: 2016-04-15

## 2016-04-15 MED ORDER — TICAGRELOR 90 MG PO TABS
90.0000 mg | ORAL_TABLET | Freq: Two times a day (BID) | ORAL | Status: DC
  Administered 2016-04-15 – 2016-04-17 (×5): 90 mg via ORAL
  Filled 2016-04-15 (×5): qty 1

## 2016-04-15 MED ORDER — HYDRALAZINE HCL 20 MG/ML IJ SOLN: 5.0000 mg | INTRAMUSCULAR | Status: AC | PRN

## 2016-04-15 MED ORDER — CARVEDILOL 12.5 MG PO TABS: 12.5000 mg | ORAL_TABLET | Freq: Two times a day (BID) | ORAL | Status: DC

## 2016-04-15 MED ORDER — HEPARIN SODIUM (PORCINE) 5000 UNIT/ML IJ SOLN
INTRAMUSCULAR | Status: AC
  Administered 2016-04-15: 4000 [IU]
  Filled 2016-04-15: qty 1

## 2016-04-15 MED ORDER — ACETAMINOPHEN 325 MG PO TABS: 650.0000 mg | ORAL_TABLET | ORAL | Status: DC | PRN

## 2016-04-15 MED ORDER — SIMVASTATIN 40 MG PO TABS: 40.0000 mg | ORAL_TABLET | Freq: Every day | ORAL | Status: DC

## 2016-04-15 MED ORDER — SODIUM CHLORIDE 0.9 % IV SOLN: 250.0000 mL | INTRAVENOUS | Status: DC | PRN

## 2016-04-15 MED ORDER — CO Q 10 10 MG PO CAPS: 1.0000 | ORAL_CAPSULE | Freq: Every day | ORAL | Status: DC

## 2016-04-15 MED ORDER — IOPAMIDOL (ISOVUE-370) INJECTION 76%
INTRAVENOUS | Status: DC | PRN
  Administered 2016-04-15: 65 mL via INTRA_ARTERIAL

## 2016-04-15 MED ORDER — TICAGRELOR 90 MG PO TABS
ORAL_TABLET | ORAL | Status: DC | PRN
  Administered 2016-04-15: 180 mg via ORAL

## 2016-04-15 SURGICAL SUPPLY — 18 items
BALLN EUPHORA RX 2.5X15 (BALLOONS) ×2
BALLN ~~LOC~~ EMERGE MR 3.75X12 (BALLOONS) ×2
BALLOON EUPHORA RX 2.5X15 (BALLOONS) IMPLANT
BALLOON ~~LOC~~ EMERGE MR 3.75X12 (BALLOONS) IMPLANT
CATH EXTRAC PRONTO 5.5F 138CM (CATHETERS) ×1 IMPLANT
CATH INFINITI JR4 5F (CATHETERS) ×1 IMPLANT
DEVICE RAD COMP TR BAND LRG (VASCULAR PRODUCTS) ×1 IMPLANT
GLIDESHEATH SLEND SS 6F .021 (SHEATH) ×1 IMPLANT
GUIDE CATH RUNWAY 6FR CLS3 (CATHETERS) ×1 IMPLANT
GUIDEWIRE INQWIRE 1.5J.035X260 (WIRE) IMPLANT
INQWIRE 1.5J .035X260CM (WIRE) ×2
KIT ENCORE 26 ADVANTAGE (KITS) ×1 IMPLANT
KIT HEART LEFT (KITS) ×2 IMPLANT
PACK CARDIAC CATHETERIZATION (CUSTOM PROCEDURE TRAY) ×2 IMPLANT
STENT SYNERGY DES 3X20 (Permanent Stent) ×1 IMPLANT
TRANSDUCER W/STOPCOCK (MISCELLANEOUS) ×2 IMPLANT
TUBING CIL FLEX 10 FLL-RA (TUBING) ×2 IMPLANT
WIRE ASAHI PROWATER 180CM (WIRE) ×1 IMPLANT

## 2016-04-15 NOTE — Progress Notes (Signed)
CARDIAC REHAB PHASE I   PRE:  Rate/Rhythm: 85 SR  BP:  Sitting: 126/65        SaO2: 97 RA  MODE:  Ambulation: 470 ft   POST:  Rate/Rhythm: 92 SR  BP:  Sitting: 152/68         SaO2: 97 RA  Pt ambulated 470 ft on RA, handheld assist, steady gait, tolerated well with no complaints. Completed MI/stent education with pt, pt's wife and daughter at bedside.  Reviewed risk factors, MI book, anti-platelet therapy, stent card, activity restrictions, ntg, exercise, heart healthy diet, and phase 2 cardiac rehab. Pt verbalized understanding, receptive to education, states he has been under quite a bit of personal stress recently. Pt agrees to phase 2 cardiac rehab referral, [facility]. Pt to bed for echo after walk, call bell within reach. Will follow.  [phone] Joylene GrapesEmily C Marea Reasner, RN, BSN 04/15/2016 12:51 PM

## 2016-04-15 NOTE — Progress Notes (Signed)
Trband deflated at 0330.  Band removed.  No bleeding noted.  Some bruising noted on the forearm near site.Monitoring.

## 2016-04-15 NOTE — Consult Note (Signed)
Chaplain responded to the [facility] pager. The patient was taken directly to the Cath upon his arrival to the ED. His family arrived shortly after and was escorted the waiting area were the Dr. Jovita GammaGave them the medical report and informed them of his room, assignment. Chaplain provided reassurance, and prayer of peace and comfort on their behalf. Chaplain will follow up as needed. Chaplain Janell QuietAudrey Justene Jensen [phone]

## 2016-04-15 NOTE — Progress Notes (Signed)
Angina has resolved, has a different, mild chest soreness (pericarditis?), not clearly pleuritic. No dyspnea, no signs of CHF on exam. Brief NSVT on monitor. Cath site healthy. Keep in ICU today, transfer to floor tomorrow.  Thurmon FairMihai Asmaa Tirpak, MD, [facility] [phone] office [phone] pager

## 2016-04-15 NOTE — Care Management Note (Addendum)
Case Management Note  Patient Details  Name: Adam Andrews MRN: [id number] Date of Birth: 1950/11/20  Subjective/Objective:       Adm w mi             Action/Plan: lives w wife, pcp dr Jens Somcrenshaw   Expected Discharge Date:                  Expected Discharge Plan:  Home/Self Care  In-House Referral:     Discharge planning Services  CM Consult, Medication Assistance  Post Acute Care Choice:    Choice offered to:     DME Arranged:    DME Agency:     HH Arranged:    HH Agency:     Status of Service:  In process, will continue to follow  If discussed at Long Length of Stay Meetings, dates discussed:    Additional Comments: gave pt 30day free and copay card for brilinta. Has bcbs ins.S/W HEATHER @ [employer] # [phone]   1.BRILINTA  90 MG BID    COVER- YES  CO-PAY- $ 267.98  TIER- 4 DRUG  PRIOR APPROVAL- NO   2 PREFERRED:  CLOPIDOGREL 75 MG  COVER- YES  CO-PAY- $ 9.10  TIER- 1 DRUG  PRIOR APPROVAL- NO   PREFERRED PH[facility]ACY : [name] Adam Andrews T, RN 04/15/2016, 11:46 AM

## 2016-04-15 NOTE — ED Notes (Signed)
Pt escorted to cath lab with Dr. Eldridge DaceVaranasi and this RN

## 2016-04-15 NOTE — Progress Notes (Signed)
  Echocardiogram 2D Echocardiogram has been performed.  Adam Andrews 04/15/2016, 1:32 PM

## 2016-04-15 NOTE — H&P (Signed)
CARDIOLOGY H&P  HPI: 66 y.o. male w/ HLD, HTN, CAD s/p prior stenting, carotid stenosis, HFrEF (~35%), and TIA p/w STEMI.  Patient notes acute onset substernal chest pressure starting around 10pm tonight. He is a poor historian and "does not remember" if he has ever had pain like this before. He does think he has had heart attacks in the past, but doesn't remember the nature of his symptoms or what was done for treatment. He recalls having a TIA. His current chest pressure has remained constant, rated at 9/10 since 10pm. He did not take any nitro. He called EMS and the STEMI pager was activated given some ST elevations in the anterior precordium.  In the ED, the patient continued to have chest pressure unrelieved by nitro and morphine. He was taken directly to the cath lab without labs or a repeat ECG given that his severe chest pressure persisted. In the cath lab he was found to have sub-total occlusion of his LAD with evidence of distal embolization. This was stented and the distal vessel was ballooned. The patient's chest pressure resolved and he was loaded with ticagrelor.   Review of Systems:     Cardiac Review of Systems: {Y] = yes [ ]  = no  Chest Pain [ Y   ]  Resting SOB [ Y  ] Exertional SOB  [ Y ]  Orthopnea [  ]   Pedal Edema [   ]    Palpitations [  ] Syncope  [  ]   Presyncope [   ]  General Review of Systems: [Y] = yes [  ]=no Constitional: recent weight change [  ]; anorexia [  ]; fatigue [  ]; nausea [  ]; night sweats [  ]; fever [  ]; or chills [  ];                                                                     Dental: poor dentition[  ];   Eye : blurred vision [  ]; diplopia [   ]; vision changes [  ];  Amaurosis fugax[  ]; Resp: cough [  ];  wheezing[  ];  hemoptysis[  ]; shortness of breath[  ]; paroxysmal nocturnal dyspnea[  ]; dyspnea on exertion[  ]; or orthopnea[  ];  GI:  gallstones[  ], vomiting[  ];  dysphagia[  ]; melena[  ];  hematochezia [  ]; heartburn[  ];    GU: kidney stones [  ]; hematuria[  ];   dysuria [  ];  nocturia[  ];               Skin: rash [  ], swelling[  ];, hair loss[  ];  peripheral edema[  ];  or itching[  ]; Musculosketetal: myalgias[  ];  joint swelling[  ];  joint erythema[  ];  joint pain[  ];  back pain[  ];  Heme/Lymph: bruising[  ];  bleeding[  ];  anemia[  ];  Neuro: TIA[  ];  headaches[  ];  stroke[  ];  vertigo[  ];  seizures[  ];   paresthesias[  ];  difficulty walking[  ];  Psych:depression[  ]; anxiety[  ];  Endocrine: diabetes[  ];  thyroid dysfunction[  ];  Other:  Past Medical History:  Diagnosis Date  . Cardiomyopathy   . Cerebrovascular disease   . Coronary artery disease   . Dyslipidemia   . Hypertension    Benign  . TIA (transient ischemic attack)     Prior to Admission medications   Medication Sig Start Date End Date Taking? Authorizing Provider  aspirin 81 MG tablet Take 81 mg by mouth daily.    Historical Provider, MD  carvedilol (COREG) 12.5 MG tablet TAKE ONE TABLET BY MOUTH TWICE DAILY WITH A MEAL 04/02/15   Lewayne BuntingBrian S Crenshaw, MD  Coenzyme Q10 (CO Q 10) 10 MG CAPS Take 1 capsule by mouth daily.    Historical Provider, MD  glucosamine-chondroitin 500-400 MG tablet Take 2 tablets by mouth daily.    Historical Provider, MD  ibuprofen (ADVIL,MOTRIN) 200 MG tablet Take 400 mg by mouth every 6 (six) hours as needed. For back pain    Historical Provider, MD  lisinopril (PRINIVIL,ZESTRIL) 10 MG tablet TAKE ONE TABLET BY MOUTH ONCE DAILY 05/16/15   Lewayne BuntingBrian S Crenshaw, MD  Multiple Vitamin (MULTIVITAMIN WITH MINERALS) TABS Take 1 tablet by mouth daily.    Historical Provider, MD  nitroGLYCERIN (NITROSTAT) 0.4 MG SL tablet Place 1 tablet (0.4 mg total) under the tongue every 5 (five) minutes as needed. For chest pain.  May repeat times three. 01/15/14   Rosalio MacadamiaLori C Gerhardt, NP  simvastatin (ZOCOR) 40 MG tablet TAKE ONE TABLET BY MOUTH AT BEDTIME 03/05/16   Lewayne BuntingBrian S Crenshaw, MD  tamsulosin (FLOMAX) 0.4 MG CAPS  capsule Take 1 capsule by mouth daily. Take 1 tab daily 02/27/15   Historical Provider, MD    No Known Allergies  Social History   Social History  . Marital status: Married    Spouse name: N/A  . Number of children: N/A  . Years of education: N/A   Occupational History  . [employer]   Social History Main Topics  . Smoking status: Former Games developermoker  . Smokeless tobacco: Not on file     Comment: Does not smoke.  Marland Kitchen. Alcohol use 8.4 oz/week    14 Cans of beer per week  . Drug use: No  . Sexual activity: Not on file   Other Topics Concern  . Not on file   Social History Narrative   [place], [place]    Family History  Problem Relation Age of Onset  . Heart attack Mother   . Coronary artery disease Father     PHYSICAL EXAM: Vitals:   04/15/16 0121 04/15/16 0126  BP: 117/69   Pulse: 81 (!) 0  Resp: 13 (!) 0   General:  Uncomfortable, in pain HEENT: normal Neck: MMM, no JVD Cor: RRR, no r/m/g Lungs: CTAB Abdomen: soft, Nt, Nd, +BS Extremities: no cyanosis, clubbing, rash, edema Neuro: alert & oriented x 3, cranial nerves grossly intact. moves all 4 extremities w/o difficulty. Affect pleasant.  ECG: ST elevations in anterior precordium, different than prior ECGs  No results found for this or any previous visit (from the past 24 hour(s)). No results found.   ASSESSMENT: 66 y.o. male w/ HLD, HTN, CAD s/p prior stenting, carotid stenosis, HFrEF (~35%), and TIA p/w STEMI. Now s/p LAD stenting.   PLAN/DISCUSSION: - ASA 81mg  QD - Ticagrelor 90mg  BID - atorvastatin 80mg  QD - cont home lisinopril 10mg  daily - cont home coreg 12.5mg  daily - TTE in AM - ECG in AM  FULL CODE  I have examined the patient and reviewed assessment and plan and discussed with patient.  Agree with above as stated.  I saw the patient and personally reviewed the ECG and made the decision to bring the patient to the Cath Lab emergently.  Continue emergency room, he was  complaining of 10 out of 10 chest pain. It did not resolve with 10 mg of morphine. He was brought to the Cath Lab and was complaining of intense pain continuously. We gave IV Dilaudid. The patient came more comfortable.  Emergency catheterization showed subtotal occlusion of the proximal LAD. This was treated with aspiration thrombectomy and subsequent drug-eluting stent placement. He tolerated the procedure well.  He'll be watched in the ICU. I suspect to be in the hospital for 2 days. He'll need continued management for his cardiomyopathy. Increase intensity of statin. Continue dual antiplatelet therapy with Brilinta for a year.  Lance Muss

## 2016-04-15 NOTE — ED Notes (Signed)
Per Dr. Eldridge DaceVaranasi, do not obtain EKG, vitals or give pain management, escort pt to cath lab now.

## 2016-04-15 NOTE — ED Provider Notes (Signed)
MSE was initiated and I personally evaluated the patient and placed orders (if any) at  12:32 AM on April 15, 2016.  Patient is a 66 y.o. male who is here as a code STEMI. Has elevation in V1, V2 on EMS EKG.  Patient complaining of chest pressure and diaphoresis. Patient to be taken emergently to the Cath Lab. Cardiology at bedside.   Patient hemodynamically stable. Given 324 mg aspirin, 3 nitroglycerin and 4 mg of IV morphine with EMS. Patient will receive heparin in route to cath lab by ED nursing staff.  EKG not obtained in the emergency department per cardiology request as patient was taken emergently to the cardiac catheterization lab.    Layla MawKristen N Madden Piazza, DO 04/15/16 [phone]

## 2016-04-15 NOTE — ED Notes (Signed)
Pt arrives via EMS for chest pain ongoing since 2230 this evening, states it started while eating ice cream. Pt states he kept thinking it was going to go away. EMS initiated IV access and gave 10MG  morphine, 4 SL nitro and 324 MG aspirin with no change in pain. Reports 91% SpO2 initially, placed on 2L and bumped to 95%.

## 2016-04-16 ENCOUNTER — Encounter (HOSPITAL_COMMUNITY): Payer: Self-pay | Admitting: *Deleted

## 2016-04-16 DIAGNOSIS — E7800 Pure hypercholesterolemia, unspecified: Secondary | ICD-10-CM

## 2016-04-16 DIAGNOSIS — I472 Ventricular tachycardia: Secondary | ICD-10-CM

## 2016-04-16 LAB — BASIC METABOLIC PANEL
Anion gap: 7 (ref 5–15)
BUN: 12 mg/dL (ref 6–20)
CHLORIDE: 108 mmol/L (ref 101–111)
CO2: 24 mmol/L (ref 22–32)
Calcium: 8.8 mg/dL — ABNORMAL LOW (ref 8.9–10.3)
Creatinine, Ser: 0.92 mg/dL (ref 0.61–1.24)
GFR calc non Af Amer: >60 mL/min (ref >60)
Glucose, Bld: 112 mg/dL — ABNORMAL HIGH (ref 65–99)
POTASSIUM: 3.1 mmol/L — AB (ref 3.5–5.1)
SODIUM: 139 mmol/L (ref 135–145)

## 2016-04-16 LAB — CBC
HEMATOCRIT: 37.4 % — AB (ref 39.0–52.0)
Hemoglobin: 12.8 g/dL — ABNORMAL LOW (ref 13.0–17.0)
MCH: 33.9 pg (ref 26.0–34.0)
MCHC: 34.2 g/dL (ref 30.0–36.0)
MCV: 98.9 fL (ref 78.0–100.0)
Platelets: 175 10*3/uL (ref 150–400)
RBC: 3.78 MIL/uL — AB (ref 4.22–5.81)
RDW: 13.3 % (ref 11.5–15.5)
WBC: 9.1 10*3/uL (ref 4.0–10.5)

## 2016-04-16 LAB — HEMOGLOBIN A1C
Hgb A1c MFr Bld: 6 % — ABNORMAL HIGH (ref 4.8–5.6)
Mean Plasma Glucose: 126

## 2016-04-16 LAB — TROPONIN I: TROPONIN I: 4.61 ng/mL — AB (ref <0.03)

## 2016-04-16 LAB — PROTIME-INR
INR: 1.06
Prothrombin Time: 13.8 seconds (ref 11.4–15.2)

## 2016-04-16 MED ORDER — POTASSIUM CHLORIDE CRYS ER 20 MEQ PO TBCR
40.0000 meq | EXTENDED_RELEASE_TABLET | ORAL | Status: AC
  Administered 2016-04-16 (×2): 40 meq via ORAL
  Filled 2016-04-16 (×2): qty 2

## 2016-04-16 NOTE — Progress Notes (Signed)
Progress Note  Patient Name: Adam Andrews Date of Encounter: 04/16/2016  Primary Cardiologist: Jens Som  Subjective   No angina and no dyspnea - walked 10 laps in the unit last night. Echo shows LVEF 35-40%, similar to 2011. Had a lot of myalgia on simvastatin. None since hospitalized, switched to atorvastatin.  Inpatient Medications    Scheduled Meds: . aspirin  81 mg Oral Daily  . atorvastatin  80 mg Oral q1800  . carvedilol  12.5 mg Oral BID WC  . heparin  5,000 Units Subcutaneous Q8H  . lisinopril  10 mg Oral Daily  . potassium chloride  40 mEq Oral Q4H  . sodium chloride flush  3 mL Intravenous Q12H  . tamsulosin  0.4 mg Oral Daily  . ticagrelor  90 mg Oral BID   Continuous Infusions:  PRN Meds: sodium chloride, acetaminophen, acetaminophen, nitroGLYCERIN, nitroGLYCERIN, ondansetron (ZOFRAN) IV, sodium chloride flush, zolpidem   Vital Signs    Vitals:   04/16/16 0000 04/16/16 0400 04/16/16 0538 04/16/16 0700  BP: (!) 102/50 (!) 99/55 114/60   Pulse: 83 77 79   Resp: 18 17 20    Temp: 98.1 F (36.7 C) 98.6 F (37 C) 97.9 F (36.6 C) 97.8 F (36.6 C)  TempSrc: Oral Oral Oral Oral  SpO2: 94% 93% 96%   Weight:   95.1 kg (209 lb 9.6 oz)   Height:   5\' 10"  (1.778 m)     Intake/Output Summary (Last 24 hours) at 04/16/16 0913 Last data filed at 04/15/16 2100  Gross per 24 hour  Intake              720 ml  Output                0 ml  Net              720 ml   Filed Weights   04/15/16 0200 04/16/16 0538  Weight: 95.1 kg (209 lb 11.2 oz) 95.1 kg (209 lb 9.6 oz)    Telemetry    NSR, occ 3-4 beats NSVT - Personally Reviewed  ECG    NSR, lateral T wave inversion - Personally Reviewed  Physical Exam  Comfortable, relaxed GEN: No acute distress.   Neck: No JVD Cardiac: RRR, no murmurs, rubs, or gallops.  Respiratory: Clear to auscultation bilaterally. GI: Soft, nontender, non-distended  MS: No edema; No deformity. Neuro:  Nonfocal  Psych:  Normal affect   Labs    Chemistry Recent Labs Lab 04/15/16 0052 04/15/16 0233 04/16/16 0222  NA 140 139 139  K 3.6 3.7 3.1*  CL 104 105 108  CO2  --  25 24  GLUCOSE 182* 169* 112*  BUN 12 11 12   CREATININE 1.00 0.89 0.92  CALCIUM  --  9.0 8.8*  PROT  --  6.2*  --   ALBUMIN  --  4.0  --   AST  --  39  --   ALT  --  37  --   ALKPHOS  --  39  --   BILITOT  --  0.6  --   GFRNONAA  --  >60 >60  GFRAA  --  >60 >60  ANIONGAP  --  9 7     Hematology Recent Labs Lab 04/15/16 0052 04/15/16 0233 04/16/16 0222  WBC  --  11.7* 9.1  RBC  --  3.77* 3.78*  HGB 12.2* 13.0 12.8*  HCT 36.0* 37.2* 37.4*  MCV  --  98.7 98.9  MCH  --  34.5* 33.9  MCHC  --  34.9 34.2  RDW  --  13.2 13.3  PLT  --  179 175    Cardiac EnzymesNo results for input(s): TROPONINI in the last 168 hours. No results for input(s): TROPIPOC in the last 168 hours.   BNPNo results for input(s): BNP, PROBNP in the last 168 hours.   DDimer No results for input(s): DDIMER in the last 168 hours.   Radiology    No results found.  Cardiac Studies   Echo 04/15/2016 - Left ventricle: The cavity size was normal. There was mild   concentric hypertrophy. Systolic function was moderately reduced.   The estimated ejection fraction was in the range of 35% to 40%.   Diffuse hypokinesis worse in the anterolateral and inferolateral   myocardium. Doppler parameters are consistent with abnormal left   ventricular relaxation (grade 1 diastolic dysfunction). Doppler   parameters are consistent with high ventricular filling pressure. - Aortic valve: Transvalvular velocity was within the normal range.   There was no stenosis. There was no regurgitation. - Mitral valve: Transvalvular velocity was within the normal range.   There was no evidence for stenosis. There was trivial   regurgitation. - Right ventricle: The cavity size was normal. Wall thickness was   normal. Systolic function was normal. - Atrial septum: No  defect or patent foramen ovale was identified   by color flow Doppler. - Tricuspid valve: There was no regurgitation.  CATH 04/15/16 Diagnostic Diagram     Post-Intervention Diagram         Patient Profile     66 y.o. male with history of CAD and preexisting ischemic cardiomyopathy, now 36 hours s/p anterior STEMI with early reperfusion and LAD-DES, no true myocardial injury by follow up echo. Occasional NSVT, but no CHF or angina after PCI. Has history of TIA and moderate carotid disease.  Assessment & Plan    1. CAD s/p previous RCA stent, s/p ant STEMI and LAD-DES: doing well, without CHF worsening, serious arrhythmia or post infarction angina. Anticipate DC in AM. Transfer telemetry. Reinforced DAPT for 12 months. Recommend cardiac rehab. 2. CHF: appears euvolemic, well compensated. Discuss Entresto at f/u. BP will not allow titration of carvedilol or lisinopril at this time. EF above threshold for primary prevention ICD. 3. HLP:  Seems to tolerate the atorvastatin better than simva. Consider CoQ10 300 mg if myalgia recurs.  Signed, Thurmon FairMihai Linetta Regner, MD  04/16/2016, 9:13 AM

## 2016-04-16 NOTE — Progress Notes (Signed)
   Up in hall independently  MODE:  Ambulation: 2350 ft   POST:  Rate/Rhythm: 110 ST  BP:  Supine:   Sitting: 97/83  Standing:    SaO2: 95%RA 0905-0940 Pt walking independently with fast pace. Stated he walked 10 laps last night. Encouraged pt to lessen distance due to low EF and recent MI. He would have walked 4700 ft. He stated he would decrease to 5 laps. Pt denied CP or SOB. Since EF low, I gave pt CHF booklet and reviewed zones. Encouraged pt to weigh daily and to watch sodium. Gave low sodium handouts and discussed 2000 mg restriction. Discussed 2L FR. Stressed importance of brililnta use with stent.   Luetta Nuttingharlene Jamiere Gulas, RN BSN  04/16/2016 9:35 AM

## 2016-04-16 NOTE — Progress Notes (Signed)
Patient has occasional PVCs and 5 runs of V-tach,patient is asymptomatic.Dr. Loa SocksWeirnek cardiology on call made aware.Will continue to monitor.

## 2016-04-16 NOTE — Progress Notes (Signed)
CRITICAL VALUE ALERT  Critical value received:  Troponin 4.6  Date of notification:  04/16/2016    Time of notification: 11:30 AM   Critical value read back:Yes.    Nurse who received alert:  Smith Robert   MD notified (1st page):  Dr Royann Shivers  Time of first page:  11:40    Responding MD:  Dr Royann Shivers  Time MD responded:  11.42

## 2016-04-17 ENCOUNTER — Encounter (HOSPITAL_COMMUNITY): Payer: Self-pay | Admitting: Cardiology

## 2016-04-17 DIAGNOSIS — E876 Hypokalemia: Secondary | ICD-10-CM | POA: Diagnosis not present

## 2016-04-17 DIAGNOSIS — I214 Non-ST elevation (NSTEMI) myocardial infarction: Secondary | ICD-10-CM

## 2016-04-17 DIAGNOSIS — Z95820 Peripheral vascular angioplasty status with implants and grafts: Secondary | ICD-10-CM

## 2016-04-17 HISTORY — DX: Peripheral vascular angioplasty status with implants and grafts: Z95.820

## 2016-04-17 LAB — BASIC METABOLIC PANEL
ANION GAP: 8 (ref 5–15)
BUN: 11 mg/dL (ref 6–20)
CALCIUM: 9.4 mg/dL (ref 8.9–10.3)
CO2: 26 mmol/L (ref 22–32)
Chloride: 105 mmol/L (ref 101–111)
Creatinine, Ser: 0.94 mg/dL (ref 0.61–1.24)
GFR calc Af Amer: >60 mL/min (ref >60)
Glucose, Bld: 117 mg/dL — ABNORMAL HIGH (ref 65–99)
Potassium: 3.7 mmol/L (ref 3.5–5.1)
SODIUM: 139 mmol/L (ref 135–145)

## 2016-04-17 LAB — MAGNESIUM: MAGNESIUM: 2.1 mg/dL (ref 1.7–2.4)

## 2016-04-17 LAB — TROPONIN I: TROPONIN I: 2.52 ng/mL — AB (ref <0.03)

## 2016-04-17 MED ORDER — ATORVASTATIN CALCIUM 80 MG PO TABS: 80.0000 mg | ORAL_TABLET | Freq: Every day | ORAL | 6 refills | Status: DC

## 2016-04-17 MED ORDER — TICAGRELOR 90 MG PO TABS: 90.0000 mg | ORAL_TABLET | Freq: Two times a day (BID) | ORAL | 0 refills | Status: DC

## 2016-04-17 MED ORDER — TICAGRELOR 90 MG PO TABS: 90.0000 mg | ORAL_TABLET | Freq: Two times a day (BID) | ORAL | 11 refills | Status: DC

## 2016-04-17 MED ORDER — ACETAMINOPHEN 325 MG PO TABS: 650.0000 mg | ORAL_TABLET | ORAL | Status: DC | PRN

## 2016-04-17 MED ORDER — ASPIRIN 81 MG PO CHEW: 81.0000 mg | CHEWABLE_TABLET | Freq: Every day | ORAL | Status: AC

## 2016-04-17 NOTE — Progress Notes (Signed)
CARDIAC REHAB PHASE I   PRE:  Rate/Rhythm: 76  BP:  Sitting: 86/50     SaO2: 96ra  MODE:  Ambulation: 2200 ft   POST:  Rate/Rhythm: 90  BP:  Sitting: 124/60     SaO2: 95ra  1200-12:20pm Patient ambulated at a fast and steady pace. Minor hip pain (2/10) and mild shortness of breath. Placed back in bed with family at side.   Barnabas ListerMolly M Kannon Baum, MS 04/17/2016 12:16 PM

## 2016-04-17 NOTE — Discharge Summary (Signed)
Discharge Summary    Patient ID: Adam Andrews,  MRN: [id number], DOB/AGE: 03/16/1950 66 y.o.  Admit date: 04/15/2016 Discharge date: 04/17/2016  Primary Care Provider: Olga Andrews Primary Cardiologist: Dr. Jens Som  Discharge Diagnoses    Principal Problem:   Acute ST elevation myocardial infarction (STEMI) of anterior wall [facility]) Active Problems:   S/P angioplasty with stent, 04/15/16  DES to prox LAD   Coronary atherosclerosis, hx of stent to RCA that is patent on cath 04/15/16   Hyperlipidemia LDL goal <70   Cardiomyopathy (HCC)   Hypokalemia   Allergies No Known Allergies  Diagnostic Studies/Procedures    04/15/2016 by Dr. Eldridge Dace Procedures   Coronary Stent Intervention  Left Heart Cath and Coronary Angiography  Conclusion     Patent stent in the RCA.  Prox LAD lesion, 99 %stenosed, thrombotic, culprit lesion. A STENT SYNERGY DES 3X20 drug eluting stent was successfully placed. and post dilated to 3.75 mm proximally.  Post intervention, there is a 0% residual stenosis.  LV end diastolic pressure is moderately elevated.   Continue dual antiplatelet therapy for at least a year.  Would use Brilinta if there are no bleeding contraindications.  Consider longer antiplatelet therapy with clopidogrel.  Continue tirofiban for 1 hour post procedure.    He will need aggressive secondary prevention.  Change statin to higher potency, atorvastatin 80 mg daily. Continue medical therapy for his cardiomyopathy.      04/15/2016   2D Echo   Study Conclusions  - Left ventricle: The cavity size was normal. There was mild   concentric hypertrophy. Systolic function was moderately reduced.   The estimated ejection fraction was in the range of 35% to 40%.   Diffuse hypokinesis worse in the anterolateral and inferolateral   myocardium. Doppler parameters are consistent with abnormal left   ventricular relaxation (grade 1 diastolic dysfunction). Doppler  parameters are consistent with high ventricular filling pressure. - Aortic valve: Transvalvular velocity was within the normal range.   There was no stenosis. There was no regurgitation. - Mitral valve: Transvalvular velocity was within the normal range.   There was no evidence for stenosis. There was trivial   regurgitation. - Right ventricle: The cavity size was normal. Wall thickness was   normal. Systolic function was normal. - Atrial septum: No defect or patent foramen ovale was identified   by color flow Doppler. - Tricuspid valve: There was no regurgitation. _____________   History of Present Illness     66 y.o. male w/ HLD, HTN, CAD s/p prior stenting, carotid stenosis, HFrEF (~35%), and TIA p/w STEMI presented to [facility] with acute onset SS chest pressure, began around 2200.  Rated it 9/10 no NTG and EMS was called.  In ER NTG and Morphine relieved chest pressure and pt had STEMI of ant wall and taken to cath lab emergently.  LAD was occluded at 99%  With pt undergoing emergent stent with PTCA to prox LAD.   EKG prior to discharge with SR and flipped T waves in lateral leads.    Hospital Course     Consultants: none   Pt was admitted to CCU and did well, with brief run of NSVT on monitor.  His EF is diminished but the same as in 2011.  His zocor was changed to lipitor with hx myalgias on zocor.  Pt did well and by 04/17/16 he was seen and evaluated by Dr. Wyline Mood and found stable for discharge.  He did have hypokalemia but this  was replaced and on labs prior to discharge K+ 3.7.  tropoin pk was 4.61 and at discharge 2.52.   He has been ambulated without problems with cardiac rehab.  He will follow up in 5-7 days in the office.   30 day free card for Brilinta has been provided.  _____________  Discharge Vitals Blood pressure (!) 146/65, pulse 86, temperature 97.9 F (36.6 C), temperature source Oral, resp. rate 18, height 5\' 10"  (1.778 m), weight 207 lb 1.6 oz (93.9 kg), SpO2 97 %.    Filed Weights   04/15/16 0200 04/16/16 0538 04/17/16 0520  Weight: 209 lb 11.2 oz (95.1 kg) 209 lb 9.6 oz (95.1 kg) 207 lb 1.6 oz (93.9 kg)    Labs & Radiologic Studies    CBC  Recent Labs  04/15/16 0233 04/16/16 0222  WBC 11.7* 9.1  NEUTROABS 10.3*  --   HGB 13.0 12.8*  HCT 37.2* 37.4*  MCV 98.7 98.9  PLT 179 175   Basic Metabolic Panel  Recent Labs  04/16/16 0222 04/17/16 1343  NA 139 139  K 3.1* 3.7  CL 108 105  CO2 24 26  GLUCOSE 112* 117*  BUN 12 11  CREATININE 0.92 0.94  CALCIUM 8.8* 9.4  MG  --  2.1   Liver Function Tests  Recent Labs  04/15/16 0233  AST 39  ALT 37  ALKPHOS 39  BILITOT 0.6  PROT 6.2*  ALBUMIN 4.0   No results for input(s): LIPASE, AMYLASE in the last 72 hours. Cardiac Enzymes  Recent Labs  04/16/16 1004 04/17/16 1343  TROPONINI 4.61* 2.52*    Hemoglobin A1C  Recent Labs  04/15/16 0233  HGBA1C 6.0*   Fasting Lipid Panel  Recent Labs  04/15/16 0233  CHOL 101  HDL 32*  LDLCALC 60  TRIG 43  CHOLHDL 3.2   Thyroid Function Tests No results for input(s): TSH, T4TOTAL, T3FREE, THYROIDAB in the last 72 hours.  Invalid input(s): FREET3 _____________  No results found. Disposition   Pt is being discharged home today in good condition.  Follow-up Plans & Appointments   Call [facility] at [phone] if any bleeding, swelling or drainage at cath site.  May shower, no tub baths for 48 hours for groin sticks. No lifting over 5 pounds for 10 days.  No Driving for 5 days.  Now work until seen in the office.  Heart Healthy Diet  Take 1 NTG, under your tongue, while sitting.  If no relief of pain may repeat NTG, one tab every 5 minutes up to 3 tablets total over 15 minutes.  If no relief CALL 911.  If you have dizziness/lightheadness  while taking NTG, stop taking and call 911.        Do not stop Brilinta or asprin or you may have a heart attack.  Call if you have any problems obtaining the  medication.    Follow-up Information    Adam Millers, MD Follow up.   Specialty:  Cardiology Why:  the office will call Monday or Tuesday for follow up date.  if you have not heard by [place] please call the office.  Contact information: [address] [place] [address] [phone]          Discharge Instructions    Amb Referral to Cardiac Rehabilitation    Complete by:  As directed    Diagnosis:   Coronary Stents STEMI        Discharge Medications   Current Discharge  Medication List    START taking these medications   Details  acetaminophen (TYLENOL) 325 MG tablet Take 2 tablets (650 mg total) by mouth every 4 (four) hours as needed for headache or mild pain.    aspirin 81 MG chewable tablet Chew 1 tablet (81 mg total) by mouth daily.    atorvastatin (LIPITOR) 80 MG tablet Take 1 tablet (80 mg total) by mouth daily at 6 PM. Qty: 30 tablet, Refills: 6    ticagrelor (BRILINTA) 90 MG TABS tablet Take 1 tablet (90 mg total) by mouth 2 (two) times daily. Qty: 60 tablet, Refills: 11      CONTINUE these medications which have NOT CHANGED   Details  carvedilol (COREG) 12.5 MG tablet TAKE ONE TABLET BY MOUTH TWICE DAILY WITH A MEAL Qty: 180 tablet, Refills: 3    lisinopril (PRINIVIL,ZESTRIL) 10 MG tablet TAKE ONE TABLET BY MOUTH ONCE DAILY Qty: 90 tablet, Refills: 3    Multiple Vitamin (MULTIVITAMIN WITH MINERALS) TABS Take 1 tablet by mouth daily.    nitroGLYCERIN (NITROSTAT) 0.4 MG SL tablet Place 1 tablet (0.4 mg total) under the tongue every 5 (five) minutes as needed. For chest pain.  May repeat times three. Qty: 25 tablet, Refills: 3    Oxymetazoline HCl (NASAL SPRAY NA) Place 2 sprays into both nostrils at bedtime.    tamsulosin (FLOMAX) 0.4 MG CAPS capsule Take 0.4 mg by mouth daily.  Refills: 4      STOP taking these medications     aspirin 81 MG tablet      ibuprofen (ADVIL,MOTRIN) 200 MG tablet      simvastatin (ZOCOR) 40 MG  tablet          Aspirin prescribed at discharge?  Yes High Intensity Statin Prescribed? (Lipitor 40-80mg  or Crestor 20-40mg ): Yes Beta Blocker Prescribed? Yes For EF <40%, was ACEI/ARB Prescribed? Yes ADP Receptor Inhibitor Prescribed? (i.e. Plavix etc.-Includes Medically Managed Patients): Yes For EF <40%, Aldosterone Inhibitor Prescribed? No: may be added as outpt Was EF assessed during THIS hospitalization? Yes Was Cardiac Rehab II ordered? (Included Medically managed Patients): Yes   Outstanding Labs/Studies   Would check BMP at visit and will need Lipids and Hepatic as outpt.  Duration of Discharge Encounter   Greater than 30 minutes including physician time.  Signed, Adam BoozerLaura Easton Fetty NP 04/17/2016, 3:25 PM

## 2016-04-17 NOTE — Discharge Instructions (Signed)
Call [facility] at [phone] if any bleeding, swelling or drainage at cath site.  May shower, no tub baths for 48 hours for groin sticks. No lifting over 5 pounds for 10 days.  No Driving for 5 days.  Now work until seen in the office.  Heart Healthy Diet  Take 1 NTG, under your tongue, while sitting.  If no relief of pain may repeat NTG, one tab every 5 minutes up to 3 tablets total over 15 minutes.  If no relief CALL 911.  If you have dizziness/lightheadness  while taking NTG, stop taking and call 911.        Do not stop Brilinta or asprin or you may have a heart attack.  Call if you have any problems obtaining the medication.

## 2016-04-17 NOTE — Progress Notes (Addendum)
Subjective:    No complaints this AM  Objective:   Temp:  [97.9 F (36.6 C)-98.3 F (36.8 C)] 97.9 F (36.6 C) (02/24 0521) Pulse Rate:  [75-86] 86 (02/24 0838) Resp:  [17-18] 18 (02/24 0521) BP: (102-146)/(50-69) 146/65 (02/24 0838) SpO2:  [97 %-100 %] 97 % (02/24 0521) Weight:  [207 lb 1.6 oz (93.9 kg)] 207 lb 1.6 oz (93.9 kg) (02/24 0520) Last BM Date: 04/16/16  Filed Weights   04/15/16 0200 04/16/16 0538 04/17/16 0520  Weight: 209 lb 11.2 oz (95.1 kg) 209 lb 9.6 oz (95.1 kg) 207 lb 1.6 oz (93.9 kg)    Intake/Output Summary (Last 24 hours) at 04/17/16 1042 Last data filed at 04/17/16 0730  Gross per 24 hour  Intake              360 ml  Output                0 ml  Net              360 ml    Telemetry: SR. Isolated 5 beat run of NSVT  Exam:  General: NAD  HEENT: sclera clear, throat clear  Resp: CTAB  Cardiac: RRR, no m/r/g, no jvd  GI: abdomen soft, NT, ND  MSK:no LE edema  Neuro: no focal deficits  Psych: appropraite affect  Lab Results:  Basic Metabolic Panel:  Recent Labs Lab 04/15/16 0052 04/15/16 0233 04/16/16 0222  NA 140 139 139  K 3.6 3.7 3.1*  CL 104 105 108  CO2  --  25 24  GLUCOSE 182* 169* 112*  BUN 12 11 12   CREATININE 1.00 0.89 0.92  CALCIUM  --  9.0 8.8*    Liver Function Tests:  Recent Labs Lab 04/15/16 0233  AST 39  ALT 37  ALKPHOS 39  BILITOT 0.6  PROT 6.2*  ALBUMIN 4.0    CBC:  Recent Labs Lab 04/15/16 0052 04/15/16 0233 04/16/16 0222  WBC  --  11.7* 9.1  HGB 12.2* 13.0 12.8*  HCT 36.0* 37.2* 37.4*  MCV  --  98.7 98.9  PLT  --  179 175    Cardiac Enzymes:  Recent Labs Lab 04/16/16 1004  TROPONINI 4.61*    BNP: No results for input(s): PROBNP in the last 8760 hours.  Coagulation:  Recent Labs Lab 04/15/16 0233 04/16/16 0222  INR 1.16 1.06    ECG:   Medications:   Scheduled Medications: . aspirin  81 mg Oral Daily  . atorvastatin  80 mg Oral q1800  . carvedilol  12.5  mg Oral BID WC  . heparin  5,000 Units Subcutaneous Q8H  . lisinopril  10 mg Oral Daily  . sodium chloride flush  3 mL Intravenous Q12H  . tamsulosin  0.4 mg Oral Daily  . ticagrelor  90 mg Oral BID     Infusions:   PRN Medications:  sodium chloride, acetaminophen, acetaminophen, nitroGLYCERIN, nitroGLYCERIN, ondansetron (ZOFRAN) IV, sodium chloride flush, zolpidem     Assessment/Plan   1. CAD/STEMI/Systolic HF - patient presented with NSTEMI - cath 04/15/16 as reported below. Received DES to 99% prox LAD lesion.  - 04/15/16 echo LVEF 35-40%, grade I diastolic dysfunction - medical therapy with ASA 81, atorva 80, coreg 12.5mg  bid, lisinopril 10, brillinta - can consider entresto at outpatient f/u.   2. NSVT - PVCs and short run of NSVT last night - low K yesterday received , awaiting repeat K and Mg this AM.  Likely discharge home today once labs return.      Dina RichJonathan Zarrah Loveland, M.D.,

## 2016-04-19 ENCOUNTER — Telehealth: Payer: Self-pay | Admitting: Cardiology

## 2016-04-19 MED ORDER — NITROGLYCERIN 0.4 MG SL SUBL: 0.4000 mg | SUBLINGUAL_TABLET | SUBLINGUAL | 3 refills | Status: DC | PRN

## 2016-04-19 NOTE — Telephone Encounter (Signed)
TOC Phone Call .Marland Kitchen. Appt is 04/23/16 at 10:00am w/ Azalee CourseHao Meng

## 2016-04-19 NOTE — Telephone Encounter (Signed)
Patient contacted regarding discharge from [facility] on 05/15/16 .  [facility]. Patient understands discharge instructions? yes  Patient understands medications and regiment? yes  Patient understands to bring all medications to this visit? yes   PATIENT REQUESTED A PRESCRIPTION  FOR NTG  SENT TO [facility]- [facility]ONE

## 2016-04-20 ENCOUNTER — Telehealth: Payer: Self-pay | Admitting: Cardiology

## 2016-04-20 NOTE — Telephone Encounter (Signed)
New message   Pt verbalized that he needs something to help him sleep he has not slept in 5 days   He wants rn to go over the medications and advise him to take medication to help him sleep

## 2016-04-20 NOTE — Telephone Encounter (Signed)
Pt of Dr. Jens Somrenshaw  Called patient. Since recent hosp discharge he can't fall asleep, or if he does lays there ~ 9 hrs, sleeps ~ 30 mins no good sleep in last 5 days  He is not seeking Rx - did note he received Ambien in hospital, but is concerned for SE's & probably wouldn't retake.  Wants to know if Unisom or other OTC med ok to take w current meds.   ok to leave detailed msg If an Rx recommended, this should go [facility]

## 2016-04-21 NOTE — Telephone Encounter (Signed)
Try sleep hygiene 1st (no caffeine, low light, etc) prior to bedtime.   No drug-drug interactions with Unisom OTC and current medication.  Unisom has potential for urine retention if use daily or high doses. (Noted patient taking Flomax).  Okay to Try Unisom OTC as needed ONLY.    May try melatonin OTC as well but this may take few days before fully effective.

## 2016-04-21 NOTE — Telephone Encounter (Signed)
Phone goes to VM. Ok to leave detailed msg. Have done so, w instructions to call if further needs.

## 2016-04-23 ENCOUNTER — Encounter: Payer: Self-pay | Admitting: Physician Assistant

## 2016-04-23 ENCOUNTER — Ambulatory Visit (INDEPENDENT_AMBULATORY_CARE_PROVIDER_SITE_OTHER): Payer: BLUE CROSS/BLUE SHIELD | Admitting: Physician Assistant

## 2016-04-23 VITALS — BP 128/79 | HR 75 | Ht 70.0 in | Wt 207.2 lb

## 2016-04-23 DIAGNOSIS — Z8673 Personal history of transient ischemic attack (TIA), and cerebral infarction without residual deficits: Secondary | ICD-10-CM | POA: Diagnosis not present

## 2016-04-23 DIAGNOSIS — I779 Disorder of arteries and arterioles, unspecified: Secondary | ICD-10-CM

## 2016-04-23 DIAGNOSIS — Z95820 Peripheral vascular angioplasty status with implants and grafts: Secondary | ICD-10-CM

## 2016-04-23 DIAGNOSIS — E785 Hyperlipidemia, unspecified: Secondary | ICD-10-CM

## 2016-04-23 DIAGNOSIS — I10 Essential (primary) hypertension: Secondary | ICD-10-CM | POA: Diagnosis not present

## 2016-04-23 DIAGNOSIS — I739 Peripheral vascular disease, unspecified: Secondary | ICD-10-CM

## 2016-04-23 DIAGNOSIS — Z959 Presence of cardiac and vascular implant and graft, unspecified: Secondary | ICD-10-CM | POA: Diagnosis not present

## 2016-04-23 DIAGNOSIS — I2510 Atherosclerotic heart disease of native coronary artery without angina pectoris: Secondary | ICD-10-CM

## 2016-04-23 DIAGNOSIS — I2109 ST elevation (STEMI) myocardial infarction involving other coronary artery of anterior wall: Secondary | ICD-10-CM

## 2016-04-23 MED ORDER — TAMSULOSIN HCL 0.4 MG PO CAPS: 0.4000 mg | ORAL_CAPSULE | Freq: Every day | ORAL | 0 refills | Status: AC

## 2016-04-23 MED ORDER — CARVEDILOL 12.5 MG PO TABS: 12.5000 mg | ORAL_TABLET | Freq: Two times a day (BID) | ORAL | 3 refills | Status: DC

## 2016-04-23 MED ORDER — LISINOPRIL 10 MG PO TABS: 10.0000 mg | ORAL_TABLET | Freq: Every day | ORAL | 3 refills | Status: DC

## 2016-04-23 NOTE — Patient Instructions (Signed)
Medication Instructions:  Your physician recommends that you continue on your current medications as directed. Please refer to the Current Medication list given to you today.  If you need a refill on your cardiac medications before your next appointment, please call your pharmacy.  Labwork: NONE  Testing/Procedures: OK TO START CARDIAC REHAB-CALL AND SCHEDULE AN APPOINTMENT  Follow-Up: Your physician recommends that you schedule a follow-up appointment in: 2-3 MONTHS WITH DR Jens SomRENSHAW   [facility] you [facility] choosing [facility]    HAO MENG, PA-C NicolausMichelle, LPN

## 2016-04-23 NOTE — Progress Notes (Signed)
Cardiology Office Note    Date:  04/23/2016   ID:  Adam Andrews, DOB 24-Mar-1950, MRN [id number]  PCP:  Salli Real, MD  Cardiologist:  Dr. Jens Som  Chief Complaint  Patient presents with  . Transitions Of Care    7 day TCM followup after recent anterior MI, s/p DES to prox LAD    History of Present Illness:  Adam Andrews is a 66 y.o. male with PMH of HTN, HLD, h/o TIA, and CAD. He had a cardiac catheterization in August 2010 revealed 50% lesion at the origin of the septal, 50% D1, 50-60% LAD at the takeoff of the second diagonal, 50-60% D2. The ramus intermedius was occluded but filled with collaterals. RCA had a 90% lesion which was treated with drug-eluting stent. Carotid Doppler in December 2011 showed 40-59% right ICA and the 0-39% left ICA stenosis. His last cardiology follow-up was on 02/28/2015, Dr. Jens Som recommended a echocardiogram, however he was concerned about the cost. He also recommended a repeat carotid Doppler, patient declined due to cost. Dr. Jens Som also discussed with the patient regarding a treadmill stress test, which he will check with his insurance to see what is the cost.  Unfortunately, he presented to the hospital on 04/15/2016 shortly after midnight as anterior STEMI. Emergent cardiac catheterization performed showed patent stents in the RCA, subtotal occlusion 99% in the proximal LAD treated with Synergy 3 x 20 mm DES postdilated to 3.75 mm. LVEDP was mildly elevated. Post cath, he was placed on aspirin and Brilinta. Repeat echocardiogram on the same day showed EF 35-40%, grade 1 diastolic dysfunction, diffuse hypokinesis worse in the anterolateral and inferolateral myocardium. This ejection fraction is similar to the previous echocardiogram on 01/23/2010. He was eventually discharged on 04/17/2016.  He presents today for seven-day transition of care follow-up. He has not had any shortness of breath. His chest has occasional soreness but no pain, however  this is not related to exertion. He has been compliant with his medication. He will check the cost of present. We explained the need to be compliant with dual antiplatelet. He will likely require aspirin and Brilinta for the first year, afterward, it will be up to Dr. Jens Som to decide whether to switch him to Plavix. Given critical location of the stent, he likely will require long-term dual antiplatelet therapy. He will start cardiac rehabilitation very soon. EKG today did not reveal any significant ST-T wave changes. I have asked him to continue to observe the symptom. Further more, we also discussed the need for repeat carotid Doppler as his previous carotid Doppler artery revealed moderate disease on the right side. He did not have obvious carotid bruit on physical exam. However, he continued to wish to hold this off. He says he has not obtained the hospital bills from recent admission yet, and wish to decide afterward.  For some reason Dr. Jens Som is listed as his PCP in Epic, it appears patient is following up with Dr. Salli Real with [facility]. We'll forward note to Dr. Wynelle Link instead.    Past Medical History:  Diagnosis Date  . Cardiomyopathy   . Cerebrovascular disease   . Coronary artery disease   . Dyslipidemia   . Hypertension    Benign  . S/P angioplasty with stent, 04/15/16  DES to prox LAD 04/17/2016  . TIA (transient ischemic attack)     Past Surgical History:  Procedure Laterality Date  . CORONARY STENT INTERVENTION N/A 04/15/2016   Procedure: Coronary Stent  Intervention;  Surgeon: Corky Crafts, MD;  Location: [facility] IN[facility]AS[facility] CV LAB;  Service: Cardiovascular;  Laterality: N/A;  . LEFT HEART CATH AND CORONARY ANGIOGRAPHY N/A 04/15/2016   Procedure: Left Heart Cath and Coronary Angiography;  Surgeon: Corky Crafts, MD;  Location: [facility] INVASIVE CV LAB;  Service: Cardiovascular;  Laterality: N/A;    Current Medications: Outpatient Medications Prior to Visit    Medication Sig Dispense Refill  . aspirin 81 MG chewable tablet Chew 1 tablet (81 mg total) by mouth daily.    Marland Kitchen atorvastatin (LIPITOR) 80 MG tablet Take 1 tablet (80 mg total) by mouth daily at 6 PM. 30 tablet 6  . Multiple Vitamin (MULTIVITAMIN WITH MINERALS) TABS Take 1 tablet by mouth daily.    . nitroGLYCERIN (NITROSTAT) 0.4 MG SL tablet Place 1 tablet (0.4 mg total) under the tongue every 5 (five) minutes as needed. For chest pain.  May repeat times three. 25 tablet 3  . Oxymetazoline HCl (NASAL SPRAY NA) Place 2 sprays into both nostrils at bedtime.    . ticagrelor (BRILINTA) 90 MG TABS tablet Take 1 tablet (90 mg total) by mouth 2 (two) times daily. 60 tablet 11  . carvedilol (COREG) 12.5 MG tablet TAKE ONE TABLET BY MOUTH TWICE DAILY WITH A MEAL 180 tablet 3  . lisinopril (PRINIVIL,ZESTRIL) 10 MG tablet TAKE ONE TABLET BY MOUTH ONCE DAILY 90 tablet 3  . tamsulosin (FLOMAX) 0.4 MG CAPS capsule Take 0.4 mg by mouth daily.   4  . acetaminophen (TYLENOL) 325 MG tablet Take 2 tablets (650 mg total) by mouth every 4 (four) hours as needed for headache or mild pain.     No facility-administered medications prior to visit.      Allergies:   Patient has no known allergies.   Social History   Social History  . Marital status: Married    Spouse name: N/A  . Number of children: N/A  . Years of education: N/A   Occupational History  . [employer]   Social History Main Topics  . Smoking status: Former Games developer  . Smokeless tobacco: Never Used     Comment: Does not smoke.  Marland Kitchen Alcohol use 8.4 oz/week    14 Cans of beer per week  . Drug use: No  . Sexual activity: Not Asked   Other Topics Concern  . None   Social History Narrative   Lives in [place], [place].     Family History:  The patient's family history includes Coronary artery disease in his father; Heart attack in his mother.   ROS:   Please see the history of present illness.    ROS All other systems reviewed  and are negative.   PHYSICAL EXAM:   VS:  BP 128/79 (BP Location: Right Arm, Patient Position: Sitting, Cuff Size: Normal)   Pulse 75   Ht 5\' 10"  (1.778 m)   Wt 207 lb 3.2 oz (94 kg)   BMI 29.73 kg/m    GEN: Well nourished, well developed, in no acute distress  HEENT: normal  Neck: no JVD, carotid bruits, or masses Cardiac: RRR; no murmurs, rubs, or gallops,no edema  Respiratory:  clear to auscultation bilaterally, normal work of breathing GI: soft, nontender, nondistended, + BS MS: no deformity or atrophy  Skin: warm and dry, no rash Neuro:  Alert and Oriented x 3, Strength and sensation are intact Psych: euthymic mood, full affect  Wt Readings from Last 3 Encounters:  04/23/16 207 lb 3.2 oz (94  kg)  04/17/16 207 lb 1.6 oz (93.9 kg)  02/28/15 212 lb 5 oz (96.3 kg)      Studies/Labs Reviewed:   EKG:  EKG is ordered today.  The ekg ordered today demonstrates Normal sinus rhythm without significant ST-T wave changes.  Recent Labs: 04/15/2016: ALT 37 04/16/2016: Hemoglobin 12.8; Platelets 175 04/17/2016: BUN 11; Creatinine, Ser 0.94; Magnesium 2.1; Potassium 3.7; Sodium 139   Lipid Panel    Component Value Date/Time   CHOL 101 04/15/2016 0233   TRIG 43 04/15/2016 0233   HDL 32 (L) 04/15/2016 0233   CHOLHDL 3.2 04/15/2016 0233   VLDL 9 04/15/2016 0233   LDLCALC 60 04/15/2016 0233    Additional studies/ records that were reviewed today include:   Cath 04/15/2016 Conclusion     Patent stent in the RCA.  Prox LAD lesion, 99 %stenosed, thrombotic, culprit lesion. A STENT SYNERGY DES 3X20 drug eluting stent was successfully placed. and post dilated to 3.75 mm proximally.  Post intervention, there is a 0% residual stenosis.  LV end diastolic pressure is moderately elevated.   Continue dual antiplatelet therapy for at least a year.  Would use Brilinta if there are no bleeding contraindications.  Consider longer antiplatelet therapy with clopidogrel.  Continue  tirofiban for 1 hour post procedure.    He will need aggressive secondary prevention.  Change statin to higher potency, atorvastatin 80 mg daily. Continue medical therapy for his cardiomyopathy.      Echo 04/15/2016 LV EF: 35% -   40%  ------------------------------------------------------------------- Indications:      Abnormal blood test 790.99.  ------------------------------------------------------------------- History:   PMH:  STEMI.  Cardiomyopathy.  Risk factors: Hypertension. Dyslipidemia.  ------------------------------------------------------------------- Study Conclusions  - Left ventricle: The cavity size was normal. There was mild   concentric hypertrophy. Systolic function was moderately reduced.   The estimated ejection fraction was in the range of 35% to 40%.   Diffuse hypokinesis worse in the anterolateral and inferolateral   myocardium. Doppler parameters are consistent with abnormal left   ventricular relaxation (grade 1 diastolic dysfunction). Doppler   parameters are consistent with high ventricular filling pressure. - Aortic valve: Transvalvular velocity was within the normal range.   There was no stenosis. There was no regurgitation. - Mitral valve: Transvalvular velocity was within the normal range.   There was no evidence for stenosis. There was trivial   regurgitation. - Right ventricle: The cavity size was normal. Wall thickness was   normal. Systolic function was normal. - Atrial septum: No defect or patent foramen ovale was identified   by color flow Doppler. - Tricuspid valve: There was no regurgitation.  ASSESSMENT:    1. S/P angioplasty with stent, 04/15/16  DES to prox LAD   2. Acute ST elevation myocardial infarction (STEMI) of anterior wall (HCC)   3. Coronary artery disease involving native coronary artery of native heart without angina pectoris   4. Right-sided carotid artery disease (HCC)   5. Essential hypertension   6.  Hyperlipidemia, unspecified hyperlipidemia type   7. H/O TIA (transient ischemic attack) and stroke      PLAN:  In order of problems listed above:  1. Anterior STEMI: s/p DES to proximal LAD. He has been compliant with aspirin and Brilinta. He is concerned about the cost of Brilinta and will let us know. He understand that if he has any problem affording the Brilinta he will need to let us know immediately. I emphasized to repeatedly the importance of dual antiplatelet therapy  given the critical location of his stent. Since discharge, he does have occasional soreness of the chest, however no chest pain like the one he experienced prior to her MI. It is not associated with exertion. I have encouraged him to start cardiac rehabilitation.  2. CAD: See above. Previous RCA stent in August 2010, recently presented to the hospital and received a drug-eluting stent for subtotally occluded proximal LAD.  3. R carotid artery disease: Last carotid Doppler in 2010 revealed moderate disease on the right side. Dr. Jens Som asking to have a repeat carotid Doppler last year, he refused. I emphasized again the importance of having a repeat Doppler for the carotid. He wished to hold off for now as he is concerned about the hospital bill with recent admission.  4. HTN: Blood pressure is well controlled on carvedilol 12.5 mg twice a day and lisinopril 10 mg daily.  5. HLD: He used to be on simvastatin and had a lot of joint aches. During recent hospitalization, he was switched to Lipitor 80 mg daily. He has noticed a significant difference and improvement of the symptoms while on Lipitor. Surprisingly his lipid test recently on 04/15/2016 showed cholesterol 101, triglycerides 43, HDL 32, LDL 60. Despite subtotally occluded LAD that required stent therapy, his lipid panel is actually quite well-controlled.  6. H/o TIA: No obvious recurrence.    Medication Adjustments/Labs and Tests Ordered: Current medicines are  reviewed at length with the patient today.  Concerns regarding medicines are outlined above.  Medication changes, Labs and Tests ordered today are listed in the Patient Instructions below. Patient Instructions  Medication Instructions:  Your physician recommends that you continue on your current medications as directed. Please refer to the Current Medication list given to you today.  If you need a refill on your cardiac medications before your next appointment, please call your pharmacy.  Labwork: NONE  Testing/Procedures: OK TO START CARDIAC REHAB-CALL AND SCHEDULE AN APPOINTMENT  Follow-Up: Your physician recommends that you schedule a follow-up appointment in: 2-3 MONTHS WITH DR Jens Som   Thank you for choosing [facility] at [facility]!!    Tamico Mundo, PA-C Bryan, LPN     Ramond Dial, [place]  04/23/2016 10:12 PM    [facility] [address] [place], [place], [place]  [address] Phone: [phone]; Fax: [phone]

## 2016-04-27 ENCOUNTER — Encounter (INDEPENDENT_AMBULATORY_CARE_PROVIDER_SITE_OTHER): Payer: Self-pay

## 2016-04-28 ENCOUNTER — Telehealth (HOSPITAL_COMMUNITY): Payer: Self-pay | Admitting: Internal Medicine

## 2016-04-28 NOTE — Telephone Encounter (Signed)
Verified [employer] through Passport No Co-Pay, Deductible $4000.00, pt has met $3249.48, pt's balance is $750.52 Out of Pocket $7350.00, pt has met $3556.68, pt's balance is $3793.32. Co-InsuranceReference # [id number],  Also s/w CJ w/BCBS to verify correct information due to pt called states he was advised at [facility] that he had a Co-Pay of $10.00 which CJ advised me that he did not have a Co-Pay for Cardiac Rehab. Reference #[id number].Marland Kitchen... KJ S/w pt confirming Loss adjuster, chartered, mailed pt information on Cardiac Rehab Program and on his benefits with BCBS ... KJ

## 2016-05-10 ENCOUNTER — Telehealth (HOSPITAL_COMMUNITY): Payer: Self-pay | Admitting: Internal Medicine

## 2016-08-16 NOTE — Progress Notes (Signed)
HPI: FU CAD. Patient had PCI of his RCA in August 2010. Carotid dopplers in Dec 2011 showed a 40-59 right and a 0-39 left and fu recommended in one year. At last office visit January 2017 patient declined follow-up exercise treadmill, carotid Dopplers and echocardiogram because of cost. Patient was admitted February 2018 with acute anterior infarct. He was found to have a 99% proximal LAD which was treated with a drug-eluting stent. Echocardiogram February 2018 showed injection fraction 35-40%, grade 1 diastolic dysfunction. Since last seen, there is no dyspnea on exertion, orthopnea, PND, pedal edema, chest pain or syncope.  Current Outpatient Prescriptions  Medication Sig Dispense Refill  . aspirin 81 MG chewable tablet Chew 1 tablet (81 mg total) by mouth daily.    Marland Kitchen atorvastatin (LIPITOR) 80 MG tablet Take 1 tablet (80 mg total) by mouth daily at 6 PM. 30 tablet 6  . carvedilol (COREG) 12.5 MG tablet Take 1 tablet (12.5 mg total) by mouth 2 (two) times daily with a meal. 180 tablet 3  . Cholecalciferol ([employer] VIT D 5000 HIGH-POTENCY) 5000 units capsule Take 5,000 Units by mouth daily.    Marland Kitchen lisinopril (PRINIVIL,ZESTRIL) 10 MG tablet Take 1 tablet (10 mg total) by mouth daily. 90 tablet 3  . Multiple Vitamin (MULTIVITAMIN WITH MINERALS) TABS Take 1 tablet by mouth daily.    . nitroGLYCERIN (NITROSTAT) 0.4 MG SL tablet Place 1 tablet (0.4 mg total) under the tongue every 5 (five) minutes as needed. For chest pain.  May repeat times three. 25 tablet 3  . Oxymetazoline HCl (NASAL SPRAY NA) Place 2 sprays into both nostrils at bedtime.    . tamsulosin (FLOMAX) 0.4 MG CAPS capsule Take 1 capsule (0.4 mg total) by mouth daily. 30 capsule 0  . ticagrelor (BRILINTA) 90 MG TABS tablet Take 1 tablet (90 mg total) by mouth 2 (two) times daily. 60 tablet 11   No current facility-administered medications for this visit.      Past Medical History:  Diagnosis Date  . Cardiomyopathy   .  Cerebrovascular disease   . Coronary artery disease   . Dyslipidemia   . Hypertension    Benign  . S/P angioplasty with stent, 04/15/16  DES to prox LAD 04/17/2016  . TIA (transient ischemic attack)     Past Surgical History:  Procedure Laterality Date  . CORONARY STENT INTERVENTION N/A 04/15/2016   Procedure: Coronary Stent Intervention;  Surgeon: Corky Crafts, MD;  [facility] CV LAB;  Service: Cardiovascular;  Laterality: N/A;  . LEFT HEART CATH AND CORONARY ANGIOGRAPHY N/A 04/15/2016   Procedure: Left Heart Cath and Coronary Angiography;  Surgeon: Corky Crafts, MD;  Location: [facility] INVASIVE CV LAB;  Service: Cardiovascular;  Laterality: N/A;    Social History   Social History  . Marital status: Married    Spouse name: N/A  . Number of children: N/A  . Years of education: N/A   Occupational History  . [employer]   Social History Main Topics  . Smoking status: Former Games developer  . Smokeless tobacco: Never Used     Comment: Does not smoke.  Marland Kitchen Alcohol use 8.4 oz/week    14 Cans of beer per week  . Drug use: No  . Sexual activity: Not on file   Other Topics Concern  . Not on file   Social History Narrative   Lives in [place], [place].    Family History  Problem Relation Age of Onset  .  Heart attack Mother   . Coronary artery disease Father     ROS: no fevers or chills, productive cough, hemoptysis, dysphasia, odynophagia, melena, hematochezia, dysuria, hematuria, rash, seizure activity, orthopnea, PND, pedal edema, claudication. Remaining systems are negative.  Physical Exam: Well-developed well-nourished in no acute distress.  Skin is warm and dry.  HEENT is normal.  Neck is supple. No bruits Chest is clear to auscultation with normal expansion.  Cardiovascular exam is regular rate and rhythm.  Abdominal exam nontender or distended. No masses palpated. Extremities show no edema. neuro grossly intact   A/P  1 Carotid artery  disease-continue aspirin. Schedule follow-up carotid Dopplers.  2 Ischemic cardiomyopathy-continue ACE inhibitor and beta blocker.   3 coronary artery disease-Continue ASA, brilinta and statin. We will continue brilinta until February 2019.  4 hypertension-blood pressure is controlled. Continue present medications.  5 hyperlipidemia-she is having myalgias likely secondary to Lipitor. We will discontinue. He had similar symptoms with simvastatin. If his symptoms improve in 4 weeks I will try low-dose Crestor. If he does not tolerate we will refer to lipid clinic for consideration of repatha.   Adam MillersBrian Riordan Walle, MD

## 2016-08-19 ENCOUNTER — Ambulatory Visit (INDEPENDENT_AMBULATORY_CARE_PROVIDER_SITE_OTHER): Payer: BLUE CROSS/BLUE SHIELD | Admitting: Cardiology

## 2016-08-19 ENCOUNTER — Encounter: Payer: Self-pay | Admitting: Cardiology

## 2016-08-19 VITALS — BP 122/64 | HR 73 | Ht 70.0 in | Wt 201.6 lb

## 2016-08-19 DIAGNOSIS — I255 Ischemic cardiomyopathy: Secondary | ICD-10-CM | POA: Diagnosis not present

## 2016-08-19 DIAGNOSIS — I2510 Atherosclerotic heart disease of native coronary artery without angina pectoris: Secondary | ICD-10-CM | POA: Diagnosis not present

## 2016-08-19 DIAGNOSIS — I679 Cerebrovascular disease, unspecified: Secondary | ICD-10-CM

## 2016-08-19 DIAGNOSIS — I10 Essential (primary) hypertension: Secondary | ICD-10-CM

## 2016-08-19 DIAGNOSIS — E7800 Pure hypercholesterolemia, unspecified: Secondary | ICD-10-CM

## 2016-08-19 NOTE — Patient Instructions (Signed)
Medication Instructions:   STOP ATORVASTATIN= CALL AFTER 4 WEEKS  Testing/Procedures:  Your physician has requested that you have a carotid duplex. This test is an ultrasound of the carotid arteries in your neck. It looks at blood flow through these arteries that supply the brain with blood. Allow one hour for this exam. There are no restrictions or special instructions.    Follow-Up:  Your physician recommends that you schedule a follow-up appointment in: 6 MONTHS WITH DR Jens SomRENSHAW   If you need a refill on your cardiac medications before your next appointment, please call your pharmacy.

## 2016-09-17 ENCOUNTER — Ambulatory Visit (HOSPITAL_COMMUNITY)
Admission: RE | Admit: 2016-09-17 | Discharge: 2016-09-17 | Disposition: A | Discharge status: Home / Self Care | Payer: BLUE CROSS/BLUE SHIELD | Source: Ambulatory Visit | Attending: Cardiovascular Disease | Admitting: Cardiovascular Disease

## 2016-09-17 DIAGNOSIS — I679 Cerebrovascular disease, unspecified: Secondary | ICD-10-CM

## 2016-09-17 DIAGNOSIS — I10 Essential (primary) hypertension: Secondary | ICD-10-CM | POA: Diagnosis not present

## 2016-09-17 DIAGNOSIS — I2510 Atherosclerotic heart disease of native coronary artery without angina pectoris: Secondary | ICD-10-CM | POA: Diagnosis not present

## 2016-09-17 DIAGNOSIS — Z8673 Personal history of transient ischemic attack (TIA), and cerebral infarction without residual deficits: Secondary | ICD-10-CM | POA: Diagnosis not present

## 2016-09-17 DIAGNOSIS — Z87891 Personal history of nicotine dependence: Secondary | ICD-10-CM | POA: Insufficient documentation

## 2016-09-17 DIAGNOSIS — I6523 Occlusion and stenosis of bilateral carotid arteries: Secondary | ICD-10-CM | POA: Diagnosis not present

## 2016-09-17 DIAGNOSIS — E785 Hyperlipidemia, unspecified: Secondary | ICD-10-CM | POA: Diagnosis not present

## 2016-09-17 DIAGNOSIS — R42 Dizziness and giddiness: Secondary | ICD-10-CM | POA: Diagnosis not present

## 2016-09-29 ENCOUNTER — Inpatient Hospital Stay (HOSPITAL_COMMUNITY)
Admission: EM | Admit: 2016-09-29 | Discharge: 2016-10-01 | DRG: 066 | Disposition: A | Discharge status: Home / Self Care | Payer: BLUE CROSS/BLUE SHIELD | Attending: Internal Medicine | Admitting: Internal Medicine

## 2016-09-29 ENCOUNTER — Encounter (HOSPITAL_COMMUNITY): Payer: Self-pay | Admitting: Neurology

## 2016-09-29 ENCOUNTER — Observation Stay (HOSPITAL_COMMUNITY): Payer: BLUE CROSS/BLUE SHIELD

## 2016-09-29 DIAGNOSIS — I639 Cerebral infarction, unspecified: Secondary | ICD-10-CM | POA: Diagnosis not present

## 2016-09-29 DIAGNOSIS — Z8673 Personal history of transient ischemic attack (TIA), and cerebral infarction without residual deficits: Secondary | ICD-10-CM

## 2016-09-29 DIAGNOSIS — I252 Old myocardial infarction: Secondary | ICD-10-CM

## 2016-09-29 DIAGNOSIS — R911 Solitary pulmonary nodule: Secondary | ICD-10-CM | POA: Diagnosis present

## 2016-09-29 DIAGNOSIS — I2510 Atherosclerotic heart disease of native coronary artery without angina pectoris: Secondary | ICD-10-CM | POA: Diagnosis not present

## 2016-09-29 DIAGNOSIS — I255 Ischemic cardiomyopathy: Secondary | ICD-10-CM | POA: Diagnosis present

## 2016-09-29 DIAGNOSIS — E785 Hyperlipidemia, unspecified: Secondary | ICD-10-CM | POA: Diagnosis not present

## 2016-09-29 DIAGNOSIS — I10 Essential (primary) hypertension: Secondary | ICD-10-CM | POA: Diagnosis present

## 2016-09-29 DIAGNOSIS — R7303 Prediabetes: Secondary | ICD-10-CM | POA: Diagnosis present

## 2016-09-29 DIAGNOSIS — Z87891 Personal history of nicotine dependence: Secondary | ICD-10-CM

## 2016-09-29 DIAGNOSIS — Z7982 Long term (current) use of aspirin: Secondary | ICD-10-CM

## 2016-09-29 DIAGNOSIS — I6521 Occlusion and stenosis of right carotid artery: Secondary | ICD-10-CM | POA: Diagnosis present

## 2016-09-29 DIAGNOSIS — G459 Transient cerebral ischemic attack, unspecified: Secondary | ICD-10-CM | POA: Diagnosis not present

## 2016-09-29 DIAGNOSIS — Z955 Presence of coronary angioplasty implant and graft: Secondary | ICD-10-CM

## 2016-09-29 DIAGNOSIS — Z79899 Other long term (current) drug therapy: Secondary | ICD-10-CM

## 2016-09-29 DIAGNOSIS — E7439 Other disorders of intestinal carbohydrate absorption: Secondary | ICD-10-CM | POA: Diagnosis present

## 2016-09-29 DIAGNOSIS — E1069 Type 1 diabetes mellitus with other specified complication: Secondary | ICD-10-CM

## 2016-09-29 HISTORY — DX: Atherosclerotic heart disease of native coronary artery without angina pectoris: I25.10

## 2016-09-29 HISTORY — DX: Other specified postprocedural states: Z98.890

## 2016-09-29 LAB — RAPID URINE DRUG SCREEN, HOSP PERFORMED
Amphetamines: NOT DETECTED
BENZODIAZEPINES: NOT DETECTED
Barbiturates: NOT DETECTED
Cocaine: NOT DETECTED
Opiates: NOT DETECTED
Tetrahydrocannabinol: NOT DETECTED

## 2016-09-29 LAB — URINALYSIS, ROUTINE W REFLEX MICROSCOPIC
Bilirubin Urine: NEGATIVE
Glucose, UA: NEGATIVE mg/dL
Hgb urine dipstick: NEGATIVE
Ketones, ur: NEGATIVE mg/dL
Leukocytes, UA: NEGATIVE
NITRITE: NEGATIVE
Protein, ur: NEGATIVE mg/dL
SPECIFIC GRAVITY, URINE: 1.009 (ref 1.005–1.030)
pH: 7 (ref 5.0–8.0)

## 2016-09-29 MED ORDER — ALPRAZOLAM 0.5 MG PO TABS
0.5000 mg | ORAL_TABLET | ORAL | Status: DC | PRN
  Administered 2016-09-29: 0.5 mg via ORAL
  Filled 2016-09-29: qty 1

## 2016-09-29 NOTE — ED Notes (Signed)
Pt given water and [place] sandwich per Dr Adela Glimpseoutova

## 2016-09-29 NOTE — ED Notes (Signed)
Patient transported to MRI 

## 2016-09-29 NOTE — Consult Note (Signed)
Requesting Physician: ED MD    Chief Complaint: code stroke  History obtained from:  Patient     HPI:                                                                                                                                         Adam Andrews is an 66 y.o. male  presenting to [facility] after he experienced a transient episode of left facial left tongue left arm paresthesia along with left arm weakness. At this point patient's symptoms have fully resolved. Patient underwent a acute CT which did not show any acute abnormalities. Patient has recently back in February had a stent placed after a cardiac catheterization and myocardial infarction.   Date last known well: Date: 09/29/2016 Time last known well: Time: 14:00 tPA Given: No: symptoms resolved   Modified Rankin: Rankin Score=0    Past Medical History:  Diagnosis Date  . CAD (coronary artery disease)   . History of cardiac cath   . Hypertension     Past Surgical History:  Procedure Laterality Date  . CARDIAC CATHETERIZATION      No family history on file. Social History:  has no tobacco, alcohol, and drug history on file.  Allergies: Allergies not on file  Medications:                                                                                                                          ASA Brilliant Lisinopril     ROS:                                                                                                                                       History obtained from the patient  General ROS: negative for - chills, fatigue, fever,  night sweats, weight gain or weight loss Psychological ROS: negative for - behavioral disorder, hallucinations, memory difficulties, mood swings or suicidal ideation Ophthalmic ROS: negative for - blurry vision, double vision, eye pain or loss of vision ENT ROS: negative for - epistaxis, nasal discharge, oral lesions, sore throat, tinnitus or vertigo Allergy  and Immunology ROS: negative for - hives or itchy/watery eyes Hematological and Lymphatic ROS: negative for - bleeding problems, bruising or swollen lymph nodes Endocrine ROS: negative for - galactorrhea, hair pattern changes, polydipsia/polyuria or temperature intolerance Respiratory ROS: negative for - cough, hemoptysis, shortness of breath or wheezing Cardiovascular ROS: negative for - chest pain, dyspnea on exertion, edema or irregular heartbeat Gastrointestinal ROS: negative for - abdominal pain, diarrhea, hematemesis, nausea/vomiting or stool incontinence Genito-Urinary ROS: negative for - dysuria, hematuria, incontinence or urinary frequency/urgency Musculoskeletal ROS: negative for - joint swelling or muscular weakness Neurological ROS: as noted in HPI Dermatological ROS: negative for rash and skin lesion changes  Neurologic Examination:                                                                                                      There were no vitals taken for this visit.  HEENT-  Normocephalic, no lesions, without obvious abnormality.  Normal external eye and conjunctiva.  Normal TM's bilaterally.  Normal auditory canals and external ears. Normal external nose, mucus membranes and septum.  Normal pharynx. Cardiovascular- S1, S2 normal, pulses palpable throughout   Lungs- chest clear, no wheezing, rales, normal symmetric air entry Abdomen- normal findings: bowel sounds normal Extremities- no edema Lymph-no adenopathy palpable Musculoskeletal-no joint tenderness, deformity or swelling Skin-warm and dry, no hyperpigmentation, vitiligo, or suspicious lesions  Neurological Examination Mental Status: Alert, oriented, thought content appropriate.  Speech fluent without evidence of aphasia.  Able to follow 3 step commands without difficulty. Cranial Nerves: II: Discs flat bilaterally; Visual fields grossly normal,  III,IV, VI: ptosis not present, extra-ocular motions intact  bilaterally, pupils equal, round, reactive to light and accommodation V,VII: smile symmetric, facial light touch sensation normal bilaterally VIII: hearing normal bilaterally IX,X: uvula rises symmetrically XI: bilateral shoulder shrug XII: midline tongue extension Motor: Right : Upper extremity   5/5    Left:     Upper extremity   5/5  Lower extremity   5/5     Lower extremity   5/5 Tone and bulk:normal tone throughout; no atrophy noted Sensory: Pinprick and light touch intact throughout, bilaterally Deep Tendon Reflexes: 2+ and symmetric throughout Plantars: Right: downgoing   Left: downgoing Cerebellar: normal finger-to-nose, normal rapid alternating movements and normal heel-to-shin test Gait: normal gait and station       Lab Results: Basic Metabolic Panel: No results for input(s): NA, K, CL, CO2, GLUCOSE, BUN, CREATININE, CALCIUM, MG, PHOS in the last 168 hours.  Liver Function Tests: No results for input(s): AST, ALT, ALKPHOS, BILITOT, PROT, ALBUMIN in the last 168 hours. No results for input(s): LIPASE, AMYLASE in the last 168 hours. No results for input(s): AMMONIA in the last 168 hours.  CBC: No results for input(s): WBC, NEUTROABS,  HGB, HCT, MCV, PLT in the last 168 hours.  Cardiac Enzymes: No results for input(s): CKTOTAL, CKMB, CKMBINDEX, TROPONINI in the last 168 hours.  Lipid Panel: No results for input(s): CHOL, TRIG, HDL, CHOLHDL, VLDL, LDLCALC in the last 168 hours.  CBG: No results for input(s): GLUCAP in the last 168 hours.  Microbiology: No results found for this or any previous visit.  Coagulation Studies: No results for input(s): LABPROT, INR in the last 72 hours.  Imaging: No results found.     Assessment and plan discussed with with attending physician and they are in agreement.    Felicie Morn PA-C [facility] [phone]  09/29/2016, 5:47 PM   Assessment: 66 y.o. male presenting with transient left arm weakness and  left arm numbness, left face numbness. Symptoms are fully resolved. Patient is not a TPA candidate secondary to resolve symptoms. CT of head did not show any acute abnormalities. At this time if patient should be evaluated for  TIA   Stroke Risk Factors - hyperlipidemia and hypertension  Recommend:  1. HgbA1c, fasting lipid panel 2. MRI/MRA head and the brain without contrast 3. PT consult, OT consult, Speech consult 4. Echocardiogram 5. 80 mg of Atorvistatin 6. Prophylactic therapy-Antiplatelet med: Current antiplatelets 7. Risk factor modification 8. Telemetry monitoring 9. Frequent neuro checks 10 NPO until passes stroke swallow screen 11 please page stroke NP  Or  PA  Or MD from 8am -4 pm  as this patient from this time will be  followed by the stroke.   You can look them up on [email or web address]  Password [email or web address]   Ritta Slot, MD [facility] [phone]  If 7pm- 7am, please page neurology on call as listed in AMION.

## 2016-09-29 NOTE — H&P (Signed)
Adam Andrews ZOX:[id number] DOB: Jun 22, 1950 DOA: 09/29/2016     PCP: Salli Real, MD   Outpatient Specialists: none  Patient coming from:  home Lives  With family   Chief Complaint:  left facial left tongue left arm paresthesia along with left arm weakness.   HPI: Adam Andrews is a 66 y.o. male with medical history significant of CAD, HTN, TIA, MI , HLD.    Presented with left facial and left arm numbness and weakness started around 2 PM today he stayed at home for 1 hour prior to presenting to ER.  by the time patient presented to Emergency. Patient reports he was unable to grip anything, denies slurred speech.  department symptoms have resolved.  States he developed side effects from his cholesterol medicine and have been off of it for 30 days.    Regarding pertinent Chronic problems: History of coronary disease in February had a stent placement cardiac catheterization. Last TIA was 2010 he felt lightheaded confused ataxic   IN ER:  No data recorded.      on arrival  ED Triage Vitals [09/29/16 1745]  Enc Vitals Group     BP 113/89     Pulse Rate 70     Resp 12     Temp      Temp src      SpO2 98 %     Weight      Height      Head Circumference      Peak Flow      Pain Score      Pain Loc      Pain Edu?      Excl. in GC?    97.7 RR 16 96% HR 66 139/68  Following Medications were ordered in ER: Medications - No data to display   ER provider discussed case with: Neurology   Hospitalist was called for admission for TIA  Review of Systems:    Pertinent positives include: localizing neurological complaints, tingling,   weakness,  Constitutional:  No weight loss, night sweats, Fevers, chills, fatigue, weight loss  HEENT:  No headaches, Difficulty swallowing,Tooth/dental problems,Sore throat,  No sneezing, itching, ear ache, nasal congestion, post nasal drip,  Cardio-vascular:  No chest pain, Orthopnea, PND, anasarca, dizziness, palpitations.no Bilateral  lower extremity swelling  GI:  No heartburn, indigestion, abdominal pain, nausea, vomiting, diarrhea, change in bowel habits, loss of appetite, melena, blood in stool, hematemesis Resp:  no shortness of breath at rest. No dyspnea on exertion, No excess mucus, no productive cough, No non-productive cough, No coughing up of blood.No change in color of mucus.No wheezing. Skin:  no rash or lesions. No jaundice GU:  no dysuria, change in color of urine, no urgency or frequency. No straining to urinate.  No flank pain.  Musculoskeletal:  No joint pain or no joint swelling. No decreased range of motion. No back pain.  Psych:  No change in mood or affect. No depression or anxiety. No memory loss.  Neuro: no  no double vision, no gait abnormality, no slurred speech, no confusion  As per HPI otherwise 10 point review of systems negative.   Past Medical History: Past Medical History:  Diagnosis Date  . CAD (coronary artery disease)   . History of cardiac cath   . Hypertension    Past Surgical History:  Procedure Laterality Date  . CARDIAC CATHETERIZATION       Social History:  Ambulatory   independently      reports  that he has quit smoking. He has never used smokeless tobacco. His alcohol and drug histories are not on file.  Allergies:  No Known Allergies     Family History:    Family History  Problem Relation Age of Onset  . CAD Sister   . CAD Brother   . Stroke Brother     Medications: Prior to Admission medications   Medication Sig Start Date End Date Taking? Authorizing Provider  aspirin 81 MG chewable tablet Chew 81 mg by mouth daily.   Yes [provider]  BRILINTA 90 MG TABS tablet Take 90 mg by mouth 2 (two) times daily. 09/16/16  Yes [provider]  carvedilol (COREG) 12.5 MG tablet Take 12.5 mg by mouth daily. 08/17/16  Yes [provider]  cholecalciferol (VITAMIN D) 1000 units tablet Take 1,000 Units by mouth at bedtime.   Yes  [provider]  ibuprofen (ADVIL,MOTRIN) 200 MG tablet Take 400 mg by mouth every 6 (six) hours as needed (back pain).   Yes [provider]  lisinopril (PRINIVIL,ZESTRIL) 10 MG tablet Take 10 mg by mouth daily. 08/17/16  Yes [provider]  tamsulosin (FLOMAX) 0.4 MG CAPS capsule Take 0.4 mg by mouth daily as needed.  08/23/16  Yes [provider]    Physical Exam: Patient Vitals for the past 24 hrs:  BP Pulse Resp SpO2  09/29/16 1900 139/68 66 16 96 %  09/29/16 1845 136/68 64 16 95 %  09/29/16 1830 134/65 67 15 96 %  09/29/16 1815 130/63 64 17 95 %  09/29/16 1745 113/89 70 12 98 %    1. General:  in No Acute distress 2. Psychological: Alert and Oriented 3. Head/ENT:    Dry Mucous Membranes                          Head Non traumatic, neck supple                        Poor Dentition 4. SKIN: decreased Skin turgor,  Skin clean Dry and intact no rash 5. Heart: Regular rate and rhythm no Murmur, Rub or gallop 6. Lungs:   no wheezes or crackles   7. Abdomen: Soft,  non-tender, Non distended 8. Lower extremities: no clubbing, cyanosis, or edema 9. Neurologically   strength 5 out of 5 in all 4 extremities cranial nerves II through XII intact 10. MSK: Normal range of motion   body mass index is unknown because there is no height or weight on file.  Labs on Admission:   Labs on Admission: I have personally reviewed following labs and imaging studies  CBC: No results for input(s): WBC, NEUTROABS, HGB, HCT, MCV, PLT in the last 168 hours. Basic Metabolic Panel: No results for input(s): NA, K, CL, CO2, GLUCOSE, BUN, CREATININE, CALCIUM, MG, PHOS in the last 168 hours. GFR: CrCl cannot be calculated (No order found.). Liver Function Tests: No results for input(s): AST, ALT, ALKPHOS, BILITOT, PROT, ALBUMIN in the last 168 hours. No results for input(s): LIPASE, AMYLASE in the last 168 hours. No results for input(s): AMMONIA in the last 168  hours. Coagulation Profile: No results for input(s): INR, PROTIME in the last 168 hours. Cardiac Enzymes: No results for input(s): CKTOTAL, CKMB, CKMBINDEX, TROPONINI in the last 168 hours. BNP (last 3 results) No results for input(s): PROBNP in the last 8760 hours. HbA1C: No results for input(s): HGBA1C in the  last 72 hours. CBG: No results for input(s): GLUCAP in the last 168 hours. Lipid Profile: No results for input(s): CHOL, HDL, LDLCALC, TRIG, CHOLHDL, LDLDIRECT in the last 72 hours. Thyroid Function Tests: No results for input(s): TSH, T4TOTAL, FREET4, T3FREE, THYROIDAB in the last 72 hours. Anemia Panel: No results for input(s): VITAMINB12, FOLATE, FERRITIN, TIBC, IRON, RETICCTPCT in the last 72 hours. Urine analysis: No results found for: COLORURINE, APPEARANCEUR, LABSPEC, PHURINE, GLUCOSEU, HGBUR, BILIRUBINUR, KETONESUR, PROTEINUR, UROBILINOGEN, NITRITE, LEUKOCYTESUR Sepsis Labs: @LABRCNTIP (procalcitonin:4,lacticidven:4) )No results found for this or any previous visit (from the past 240 hour(s)).     UA  ordered  No results found for: HGBA1C  CrCl cannot be calculated (No order found.).  BNP (last 3 results) No results for input(s): PROBNP in the last 8760 hours.   ECG REPORT  Independently reviewed Rate: 60  Rhythm: NSR ST&T Change: No acute ischemic changes   QTC 430  There were no vitals filed for this visit.   Cultures: No results found for: SDES, SPECREQUEST, CULT, REPTSTATUS   Radiological Exams on Admission: No results found.  Chart has been reviewed    Assessment/Plan  66 y.o. male with medical history significant of CAD, HTN, TIA, MI , HLD.   Admitted for TIA  Present on Admission: . TIA (transient ischemic attack) -  - will admit based on TIA/CVA protocol, await results of MRA/MRI, Carotid Doppler and Echo, obtain cardiac enzymes,  ECG,   Lipid panel, TSH. Order PT/OT evaluation. Will make sure patient is on antiplatelet agent.    Neurology consult.      Marland Kitchen. CAD (coronary artery disease) - stable continue Brilinta and aspirin.  HLD - check lipid panel and restart statin pending results . Essential hypertension - allow some permissive hypertension for today   Other plan as per orders.  DVT prophylaxis:  SCD    Code Status:  FULL CODE  as per patient    Family Communication:   Family not  at  Bedside    Disposition Plan:      To home once workup is complete and patient is stable                    Would benefit from PT/OT eval prior to DC   ordered                                               Consults called: NEurology    Admission status:  obs   Level of care    tele           I have spent a total of 56 min on this admission  Elsi Stelzer 09/29/2016, 8:35 PM    [facility]  Pager [phone]   after 2 AM please page floor coverage PA If 7AM-7PM, please contact the day team taking care of the patient  [email or web address]  Password [email or web address]

## 2016-09-29 NOTE — ED Provider Notes (Signed)
Seen on Epic down time ED note handwritten   Doug SouJacubowitz, Kelty Szafran, MD 09/29/16 1925

## 2016-09-29 NOTE — ED Notes (Signed)
Pt arrived during downtime, pt was at home and has acute onset loss of use of L arm and L tongue, symptoms mostly resolved by arrival pt took 164 chewable aspirin

## 2016-09-29 NOTE — ED Notes (Signed)
Talked to registration to merge 2 patient charts, in process

## 2016-09-30 ENCOUNTER — Observation Stay (HOSPITAL_BASED_OUTPATIENT_CLINIC_OR_DEPARTMENT_OTHER): Payer: BLUE CROSS/BLUE SHIELD

## 2016-09-30 ENCOUNTER — Other Ambulatory Visit: Payer: Self-pay

## 2016-09-30 ENCOUNTER — Inpatient Hospital Stay (HOSPITAL_COMMUNITY): Payer: BLUE CROSS/BLUE SHIELD

## 2016-09-30 ENCOUNTER — Observation Stay (HOSPITAL_COMMUNITY): Payer: BLUE CROSS/BLUE SHIELD

## 2016-09-30 ENCOUNTER — Telehealth: Payer: Self-pay | Admitting: Cardiology

## 2016-09-30 DIAGNOSIS — E7439 Other disorders of intestinal carbohydrate absorption: Secondary | ICD-10-CM | POA: Diagnosis present

## 2016-09-30 DIAGNOSIS — I6521 Occlusion and stenosis of right carotid artery: Secondary | ICD-10-CM | POA: Diagnosis present

## 2016-09-30 DIAGNOSIS — Z8673 Personal history of transient ischemic attack (TIA), and cerebral infarction without residual deficits: Secondary | ICD-10-CM | POA: Diagnosis not present

## 2016-09-30 DIAGNOSIS — I255 Ischemic cardiomyopathy: Secondary | ICD-10-CM | POA: Diagnosis present

## 2016-09-30 DIAGNOSIS — R7303 Prediabetes: Secondary | ICD-10-CM | POA: Diagnosis present

## 2016-09-30 DIAGNOSIS — Z79899 Other long term (current) drug therapy: Secondary | ICD-10-CM | POA: Diagnosis not present

## 2016-09-30 DIAGNOSIS — I2510 Atherosclerotic heart disease of native coronary artery without angina pectoris: Secondary | ICD-10-CM | POA: Diagnosis present

## 2016-09-30 DIAGNOSIS — G451 Carotid artery syndrome (hemispheric): Secondary | ICD-10-CM

## 2016-09-30 DIAGNOSIS — I639 Cerebral infarction, unspecified: Principal | ICD-10-CM

## 2016-09-30 DIAGNOSIS — G459 Transient cerebral ischemic attack, unspecified: Secondary | ICD-10-CM

## 2016-09-30 DIAGNOSIS — I252 Old myocardial infarction: Secondary | ICD-10-CM | POA: Diagnosis not present

## 2016-09-30 DIAGNOSIS — Z87891 Personal history of nicotine dependence: Secondary | ICD-10-CM | POA: Diagnosis not present

## 2016-09-30 DIAGNOSIS — I10 Essential (primary) hypertension: Secondary | ICD-10-CM | POA: Diagnosis present

## 2016-09-30 DIAGNOSIS — R911 Solitary pulmonary nodule: Secondary | ICD-10-CM | POA: Diagnosis present

## 2016-09-30 DIAGNOSIS — Z7982 Long term (current) use of aspirin: Secondary | ICD-10-CM | POA: Diagnosis not present

## 2016-09-30 DIAGNOSIS — E785 Hyperlipidemia, unspecified: Secondary | ICD-10-CM | POA: Diagnosis present

## 2016-09-30 DIAGNOSIS — Z955 Presence of coronary angioplasty implant and graft: Secondary | ICD-10-CM | POA: Diagnosis not present

## 2016-09-30 DIAGNOSIS — Z01810 Encounter for preprocedural cardiovascular examination: Secondary | ICD-10-CM | POA: Diagnosis not present

## 2016-09-30 LAB — COMPREHENSIVE METABOLIC PANEL
ALBUMIN: 3.9 g/dL (ref 3.5–5.0)
ALT: 22 U/L (ref 17–63)
ALT: 22 U/L (ref 17–63)
ANION GAP: 10 (ref 5–15)
AST: 21 U/L (ref 15–41)
AST: 22 U/L (ref 15–41)
Albumin: 4.1 g/dL (ref 3.5–5.0)
Alkaline Phosphatase: 54 U/L (ref 38–126)
Alkaline Phosphatase: 46 U/L (ref 38–126)
Anion gap: 8 (ref 5–15)
BUN: 8 mg/dL (ref 6–20)
BUN: 8 mg/dL (ref 6–20)
CALCIUM: 8.7 mg/dL — AB (ref 8.9–10.3)
CHLORIDE: 106 mmol/L (ref 101–111)
CO2: 25 mmol/L (ref 22–32)
CO2: 25 mmol/L (ref 22–32)
CREATININE: 0.9 mg/dL (ref 0.61–1.24)
Calcium: 9.3 mg/dL (ref 8.9–10.3)
Chloride: 106 mmol/L (ref 101–111)
Creatinine, Ser: 0.85 mg/dL (ref 0.61–1.24)
GFR calc Af Amer: >60 mL/min (ref >60)
GLUCOSE: 127 mg/dL — AB (ref 65–99)
Glucose, Bld: 110 mg/dL — ABNORMAL HIGH (ref 65–99)
POTASSIUM: 3.9 mmol/L (ref 3.5–5.1)
POTASSIUM: 3.5 mmol/L (ref 3.5–5.1)
SODIUM: 139 mmol/L (ref 135–145)
Sodium: 141 mmol/L (ref 135–145)
TOTAL PROTEIN: 6.5 g/dL (ref 6.5–8.1)
Total Bilirubin: 0.5 mg/dL (ref 0.3–1.2)
Total Bilirubin: 0.7 mg/dL (ref 0.3–1.2)
Total Protein: 6.9 g/dL (ref 6.5–8.1)

## 2016-09-30 LAB — CBC
HEMATOCRIT: 38.9 % — AB (ref 39.0–52.0)
HEMOGLOBIN: 13.3 g/dL (ref 13.0–17.0)
MCH: 34.2 pg — ABNORMAL HIGH (ref 26.0–34.0)
MCHC: 34.2 g/dL (ref 30.0–36.0)
MCV: 100 fL (ref 78.0–100.0)
Platelets: 188 10*3/uL (ref 150–400)
RBC: 3.89 MIL/uL — AB (ref 4.22–5.81)
RDW: 14.2 % (ref 11.5–15.5)
WBC: 6.1 10*3/uL (ref 4.0–10.5)

## 2016-09-30 LAB — VAS US CAROTID
LCCADDIAS: -16 cm/s
LCCAPDIAS: 20 cm/s
LCCAPSYS: 168 cm/s
LEFT ECA DIAS: -12 cm/s
LICAPSYS: -66 cm/s
Left CCA dist sys: -81 cm/s
Left ICA dist dias: -24 cm/s
Left ICA dist sys: -65 cm/s
Left ICA prox dias: -16 cm/s
RCCADSYS: -80 cm/s
RCCAPSYS: 136 cm/s
RIGHT ECA DIAS: -14 cm/s
RIGHT VERTEBRAL DIAS: -13 cm/s
Right CCA prox dias: 17 cm/s

## 2016-09-30 LAB — POCT I-STAT, CHEM 8
BUN: 10 mg/dL (ref 6–20)
CALCIUM ION: 1.17 mmol/L (ref 1.15–1.40)
CHLORIDE: 105 mmol/L (ref 101–111)
Creatinine, Ser: 0.8 mg/dL (ref 0.61–1.24)
Glucose, Bld: 108 mg/dL — ABNORMAL HIGH (ref 65–99)
HEMATOCRIT: 41 % (ref 39.0–52.0)
Hemoglobin: 13.9 g/dL (ref 13.0–17.0)
Potassium: 3.9 mmol/L (ref 3.5–5.1)
SODIUM: 141 mmol/L (ref 135–145)
TCO2: 25 mmol/L (ref 0–100)

## 2016-09-30 LAB — CBC WITH DIFFERENTIAL/PLATELET
BASOS ABS: 0 10*3/uL (ref 0.0–0.1)
BASOS PCT: 0 %
EOS ABS: 0.2 10*3/uL (ref 0.0–0.7)
EOS PCT: 3 %
HCT: 39.4 % (ref 39.0–52.0)
Hemoglobin: 13.7 g/dL (ref 13.0–17.0)
Lymphocytes Relative: 22 %
Lymphs Abs: 1.5 10*3/uL (ref 0.7–4.0)
MCH: 34.1 pg — ABNORMAL HIGH (ref 26.0–34.0)
MCHC: 34.8 g/dL (ref 30.0–36.0)
MCV: 98 fL (ref 78.0–100.0)
MONO ABS: 0.7 10*3/uL (ref 0.1–1.0)
Monocytes Relative: 10 %
Neutro Abs: 4.6 10*3/uL (ref 1.7–7.7)
Neutrophils Relative %: 65 %
PLATELETS: 209 10*3/uL (ref 150–400)
RBC: 4.02 MIL/uL — ABNORMAL LOW (ref 4.22–5.81)
RDW: 13.7 % (ref 11.5–15.5)
WBC: 7 10*3/uL (ref 4.0–10.5)

## 2016-09-30 LAB — HEMOGLOBIN A1C
HEMOGLOBIN A1C: 5.9 % — AB (ref 4.8–5.6)
MEAN PLASMA GLUCOSE: 122.63 mg/dL

## 2016-09-30 LAB — LIPID PANEL
Cholesterol: 169 mg/dL (ref 0–200)
HDL: 32 mg/dL — ABNORMAL LOW (ref >40)
LDL CALC: 103 mg/dL — AB (ref 0–99)
Total CHOL/HDL Ratio: 5.3 RATIO
Triglycerides: 171 mg/dL — ABNORMAL HIGH (ref <150)
VLDL: 34 mg/dL (ref 0–40)

## 2016-09-30 LAB — POCT I-STAT TROPONIN I: TROPONIN I, POC: 0 ng/mL (ref 0.00–0.08)

## 2016-09-30 LAB — APTT: aPTT: 24 seconds (ref 24–36)

## 2016-09-30 LAB — ECHOCARDIOGRAM COMPLETE
HEIGHTINCHES: 70 in
WEIGHTICAEL: 3225.6 [oz_av]

## 2016-09-30 LAB — PROTIME-INR
INR: 1
PROTHROMBIN TIME: 13.2 s (ref 11.4–15.2)

## 2016-09-30 LAB — TROPONIN I: Troponin I: <0.03 ng/mL (ref <0.03)

## 2016-09-30 LAB — ETHANOL

## 2016-09-30 LAB — HIV ANTIBODY (ROUTINE TESTING W REFLEX): HIV SCREEN 4TH GENERATION: NONREACTIVE

## 2016-09-30 MED ORDER — ACETAMINOPHEN 160 MG/5ML PO SOLN: 650.0000 mg | ORAL | Status: DC | PRN

## 2016-09-30 MED ORDER — SODIUM CHLORIDE 0.9 % IV SOLN
INTRAVENOUS | Status: DC
  Administered 2016-09-30: 02:00:00 via INTRAVENOUS

## 2016-09-30 MED ORDER — ACETAMINOPHEN 650 MG RE SUPP: 650.0000 mg | RECTAL | Status: DC | PRN

## 2016-09-30 MED ORDER — SENNOSIDES-DOCUSATE SODIUM 8.6-50 MG PO TABS: 1.0000 | ORAL_TABLET | Freq: Every evening | ORAL | Status: DC | PRN

## 2016-09-30 MED ORDER — ACETAMINOPHEN 325 MG PO TABS: 650.0000 mg | ORAL_TABLET | ORAL | Status: DC | PRN

## 2016-09-30 MED ORDER — STROKE: EARLY STAGES OF RECOVERY BOOK
Freq: Once | Status: AC
  Administered 2016-09-30: 1
  Filled 2016-09-30: qty 1

## 2016-09-30 MED ORDER — CARVEDILOL 12.5 MG PO TABS
12.5000 mg | ORAL_TABLET | Freq: Every day | ORAL | Status: DC
  Administered 2016-09-30 – 2016-10-01 (×2): 12.5 mg via ORAL
  Filled 2016-09-30 (×2): qty 1

## 2016-09-30 MED ORDER — PERFLUTREN LIPID MICROSPHERE
1.0000 mL | INTRAVENOUS | Status: AC | PRN
  Administered 2016-09-30: 2 mL via INTRAVENOUS
  Filled 2016-09-30: qty 10

## 2016-09-30 MED ORDER — TICAGRELOR 90 MG PO TABS
90.0000 mg | ORAL_TABLET | Freq: Two times a day (BID) | ORAL | Status: DC
  Administered 2016-09-30 – 2016-10-01 (×4): 90 mg via ORAL
  Filled 2016-09-30 (×4): qty 1

## 2016-09-30 MED ORDER — TAMSULOSIN HCL 0.4 MG PO CAPS
0.4000 mg | ORAL_CAPSULE | Freq: Every day | ORAL | Status: DC
  Administered 2016-09-30 – 2016-10-01 (×2): 0.4 mg via ORAL
  Filled 2016-09-30 (×2): qty 1

## 2016-09-30 MED ORDER — ASPIRIN 81 MG PO CHEW
81.0000 mg | CHEWABLE_TABLET | Freq: Every day | ORAL | Status: DC
  Administered 2016-09-30 – 2016-10-01 (×2): 81 mg via ORAL
  Filled 2016-09-30 (×2): qty 1

## 2016-09-30 MED ORDER — SODIUM CHLORIDE 0.9 % IV BOLUS (SEPSIS)
500.0000 mL | Freq: Once | INTRAVENOUS | Status: AC
  Administered 2016-09-30: 500 mL via INTRAVENOUS

## 2016-09-30 MED ORDER — IOPAMIDOL (ISOVUE-370) INJECTION 76%
INTRAVENOUS | Status: AC
  Administered 2016-09-30: 50 mL
  Filled 2016-09-30: qty 50

## 2016-09-30 NOTE — Progress Notes (Signed)
  Echocardiogram 2D Echocardiogram with Definity has been performed.  [name], [name] 09/30/2016, 11:39 AM

## 2016-09-30 NOTE — Consult Note (Addendum)
New Carotid Patient  Requested by:  Dr. Pearlean Brownie (Neurology)  Reason for referral: right carotid stenosis >70% and R sided CVA   History of Present Illness   Adam Andrews is a 66 y.o. (04-05-1950) male who presents with chief complaint: left arm arm weakness, discoordination and anesthesia and paraesthesia.  Yesterday after noon, the patient develop acute onset of weakness in left arm while brushing his teeth.  He noted numbness and tingling in the either left arm with increasing intensity in the left hand.  He notice duscoordination of the left hand.  The patient reportedly had what sounds like a motor injury in the left shoulder and distant history of a back injury as a child, so he initially dismissed his findings as related to these prior events.  His sx and signs however did not improve and he started developing left facial and tongue anesthesia so presented at the ED for evaluation.  Reportedly, previous carotid studies demonstrated: RICA 60-79% stenosis, LICA 1-39% stenosis.  Patient reports a history of TIA but I'm not certain what he means by this.  The patient has never had amaurosis fugax or monocular blindness.  The patient has never had facial drooping or hemiplegia though he has had the above noted sx.  The patient has never had receptive or expressive aphasia.   The patient notes getting some recurrence of the left hand weakness and numbness today with use of the left arm.  The patient's risks factors for carotid disease include: HTN, HLD.  Additionally the patient is s/p PCI x 1v this Feb 2018 and s/p PCI x 2v in 2010.  Past Medical History:  Diagnosis Date  . CAD (coronary artery disease)   . History of cardiac cath   . Hypertension   HLD   Past Surgical History:  Procedure Laterality Date  . CARDIAC CATHETERIZATION      Social History   Social History  . Marital status: Married    Spouse name: N/A  . Number of children: N/A  . Years of education: N/A    Occupational History  . Not on file.   Social History Main Topics  . Smoking status: Former Games developer  . Smokeless tobacco: Never Used  . Alcohol use Not on file  . Drug use: Unknown  . Sexual activity: Not on file   Other Topics Concern  . Not on file   Social History Narrative  . No narrative on file    Family History  Problem Relation Age of Onset  . CAD Sister   . CAD Brother   . Stroke Brother     Current Facility-Administered Medications  Medication Dose Route Frequency Provider Last Rate Last Dose  . acetaminophen (TYLENOL) tablet 650 mg  650 mg Oral Q4H PRN Therisa Doyne, MD       Or  . acetaminophen (TYLENOL) solution 650 mg  650 mg Per Tube Q4H PRN Doutova, Anastassia, MD       Or  . acetaminophen (TYLENOL) suppository 650 mg  650 mg Rectal Q4H PRN Doutova, Anastassia, MD      . ALPRAZolam Prudy Feeler) tablet 0.5 mg  0.5 mg Oral PRN Therisa Doyne, MD   0.5 mg at 09/29/16 2026  . aspirin chewable tablet 81 mg  81 mg Oral Daily Doutova, Anastassia, MD   81 mg at 09/30/16 0955  . carvedilol (COREG) tablet 12.5 mg  12.5 mg Oral Q breakfast Doutova, Anastassia, MD   12.5 mg at 09/30/16 0955  . senna-docusate (  Senokot-S) tablet 1 tablet  1 tablet Oral QHS PRN Doutova, Anastassia, MD      . tamsulosin (FLOMAX) capsule 0.4 mg  0.4 mg Oral Daily Doutova, Anastassia, MD   0.4 mg at 09/30/16 0955  . ticagrelor (BRILINTA) tablet 90 mg  90 mg Oral BID Therisa Doyne, MD   90 mg at 09/30/16 0955    No Known Allergies   REVIEW OF SYSTEMS (negative unless checked):   Cardiac:  []  Chest pain or chest pressure? []  Shortness of breath upon activity? []  Shortness of breath when lying flat? []  Irregular heart rhythm?  Vascular:  []  Pain in calf, thigh, or hip brought on by walking? []  Pain in feet at night that wakes you up from your sleep? []  Blood clot in your veins? []  Leg swelling?  Pulmonary:  []  Oxygen at home? []  Productive cough? []   Wheezing?  Neurologic:  [x]  Sudden weakness in arms or legs? [x]  Sudden numbness in arms or legs? []  Sudden onset of difficult speaking or slurred speech? []  Temporary loss of vision in one eye? []  Problems with dizziness?  Gastrointestinal:  []  Blood in stool? []  Vomited blood?  Genitourinary:  []  Burning when urinating? []  Blood in urine?  Psychiatric:  []  Major depression  Hematologic:  []  Bleeding problems? []  Problems with blood clotting?  Dermatologic:  []  Rashes or ulcers?  Constitutional:  []  Fever or chills?  Ear/Nose/Throat:  []  Change in hearing? []  Nose bleeds? []  Sore throat?  Musculoskeletal:  []  Back pain? []  Joint pain? []  Muscle pain?   For VQI Use Only   PRE-ADM LIVING Home  AMB STATUS Ambulatory  CAD Sx History of MI, but no symptoms MI < 6 months ago  PRIOR CHF None  STRESS TEST No    Physical Examination     Vitals:   09/30/16 1000 09/30/16 1248 09/30/16 1406 09/30/16 1635  BP: (!) 143/65 133/66 125/64 131/78  Pulse: 77 78 73 77  Resp: 16 15 18 18   Temp: 98.1 F (36.7 C) 98.6 F (37 C) 98 F (36.7 C) 98 F (36.7 C)  TempSrc: Oral Oral Oral Oral  SpO2: 96% 96% 95% 96%  Weight:      Height:       Body mass index is 28.93 kg/m.  General Alert, O x 3, WD, NAD  Head /AT,    Ear/Nose/ Throat Hearing grossly intact, nares without erythema or drainage, oropharynx without Erythema or Exudate, Mallampati score: 3,   Eyes PERRLA, EOMI,    Neck Supple, mid-line trachea,    Pulmonary Sym exp, good B air movt, CTA B  Cardiac RRR, Nl S1, S2, no Murmurs, No rubs, No S3,S4  Vascular Vessel Right Left  Radial Palpable Palpable  Brachial Palpable Palpable  Carotid Palpable, No Bruit Palpable, No Bruit  Aorta Not palpable N/A  Femoral Palpable Palpable  Popliteal Not palpable Not palpable  PT Palpable Palpable  DP Palpable Palpable    Gastro- intestinal soft, non-distended, non-tender to palpation, No guarding or rebound, no  HSM, no masses, no CVAT B, No palpable prominent aortic pulse,    Musculo- skeletal M/S 5/5 throughout except mildly weaker L hand grip 4-5/5  , Extremities without ischemic changes  , No edema present, No visible varicosities , No Lipodermatosclerosis present  Neurologic Cranial nerves 2-12 intact , Pain and light touch intact in extremities , Motor exam as listed above  Psychiatric Judgement intact, Mood & affect appropriate for pt's clinical situation  Dermatologic See M/S  exam for extremity exam, No rashes otherwise noted  Lymphatic  Palpable lymph nodes: None     Non-invasive Vascular Imaging   B Carotid Duplex (09/30/16):  R ICA stenosis:  60-79% R VA: patent and antegrade L ICA stenosis:  1-39% L VA: patent and bidirectional  TTE (09/30/16)  EF 55-60%  No cardiac source of emboli was indentified.   Laboratory   CBC CBC Latest Ref Rng & Units 09/30/2016 09/29/2016 09/29/2016  WBC 4.0 - 10.5 K/uL 6.1 - 7.0  Hemoglobin 13.0 - 17.0 g/dL 19.1 47.8 29.5  Hematocrit 39.0 - 52.0 % 38.9(L) 41.0 39.4  Platelets 150 - 400 K/uL 188 - 209    BMP BMP Latest Ref Rng & Units 09/30/2016 09/29/2016 09/29/2016  Glucose 65 - 99 mg/dL 621(H) 086(V) 784(O)  BUN 6 - 20 mg/dL 8 10 8   Creatinine 0.61 - 1.24 mg/dL 9.62 9.52 8.41  Sodium 135 - 145 mmol/L 141 141 139  Potassium 3.5 - 5.1 mmol/L 3.5 3.9 3.9  Chloride 101 - 111 mmol/L 106 105 106  CO2 22 - 32 mmol/L 25 - 25  Calcium 8.9 - 10.3 mg/dL 3.2(G) - 9.3    Coagulation Lab Results  Component Value Date   INR 1.00 09/29/2016   No results found for: PTT  Lipids    Component Value Date/Time   CHOL 169 09/30/2016 0431   TRIG 171 (H) 09/30/2016 0431   HDL 32 (L) 09/30/2016 0431   CHOLHDL 5.3 09/30/2016 0431   VLDL 34 09/30/2016 0431   LDLCALC 103 (H) 09/30/2016 0431     Radiology     Dg Chest 2 View  Result Date: 09/30/2016 CLINICAL DATA:  Transient ischemic attack EXAM: CHEST  2 VIEW COMPARISON:  None. FINDINGS: Minimal linear  scarring or atelectasis is noted at the left costophrenic angle. No pneumonia or effusion is seen. Mediastinal and hilar contours are unremarkable. The heart is within normal limits in size. No bony abnormality is seen. IMPRESSION: No active cardiopulmonary disease. Electronically Signed   By: Dwyane Dee M.D.   On: 09/30/2016 08:05   Mr Maxine Glenn Head Wo Contrast  Result Date: 09/29/2016 CLINICAL DATA:  66 y/o M; left facial and left arm numbness with weakness. EXAM: MRI HEAD WITHOUT CONTRAST MRA HEAD WITHOUT CONTRAST TECHNIQUE: Multiplanar, multiecho pulse sequences of the brain and surrounding structures were obtained without intravenous contrast. Angiographic images of the head were obtained using MRA technique without contrast. COMPARISON:  None. FINDINGS: MRI HEAD FINDINGS Brain: Subcentimeter foci of reduced diffusion centered around the right superolateral central sulcus in the motor and sensory cortices (series 3, image 38). No acute hemorrhage. Small chronic infarction in right postcentral gyrus an within left mid corona radiata. No focal mass effect, extra-axial collection, or hydrocephalus. Vascular: As below. Skull and upper cervical spine: Normal marrow signal. Sinuses/Orbits: Negative. Other: None. MRA HEAD FINDINGS Anterior circulation: No large vessel occlusion, aneurysm, or significant stenosis is identified. Mild irregularity of bilateral paraclinoid internal carotid arteries without significant stenosis likely representing intracranial atherosclerosis. Posterior circulation: No large vessel occlusion, aneurysm, or significant stenosis is identified. Right dominant vertebrobasilar system. Anatomic variant: Anterior communicating artery and right posterior communicating arteries are patent. Left AICA/ PICA. IMPRESSION: MRI of the head: 1. Subcentimeter acute/early subacute infarction centered along right superolateral central sulcus in motor and sensory cortices. No acute hemorrhage. 2. Small chronic  cortical infarction in the right postcentral gyrus and within the left mid corona radiata. MRA of the head: Patent circle of Willis. No  large vessel occlusion, aneurysm, or significant stenosis is identified. These results will be called to the ordering clinician or representative by the Radiologist Assistant, and communication documented in the PACS or zVision Dashboard. Electronically Signed   By: Mitzi HansenLance  Furusawa-Stratton M.D.   On: 09/29/2016 22:35   Mr Brain Wo Contrast  Result Date: 09/29/2016 CLINICAL DATA:  66 y/o M; left facial and left arm numbness with weakness. EXAM: MRI HEAD WITHOUT CONTRAST MRA HEAD WITHOUT CONTRAST TECHNIQUE: Multiplanar, multiecho pulse sequences of the brain and surrounding structures were obtained without intravenous contrast. Angiographic images of the head were obtained using MRA technique without contrast. COMPARISON:  None. FINDINGS: MRI HEAD FINDINGS Brain: Subcentimeter foci of reduced diffusion centered around the right superolateral central sulcus in the motor and sensory cortices (series 3, image 38). No acute hemorrhage. Small chronic infarction in right postcentral gyrus an within left mid corona radiata. No focal mass effect, extra-axial collection, or hydrocephalus. Vascular: As below. Skull and upper cervical spine: Normal marrow signal. Sinuses/Orbits: Negative. Other: None. MRA HEAD FINDINGS Anterior circulation: No large vessel occlusion, aneurysm, or significant stenosis is identified. Mild irregularity of bilateral paraclinoid internal carotid arteries without significant stenosis likely representing intracranial atherosclerosis. Posterior circulation: No large vessel occlusion, aneurysm, or significant stenosis is identified. Right dominant vertebrobasilar system. Anatomic variant: Anterior communicating artery and right posterior communicating arteries are patent. Left AICA/ PICA. IMPRESSION: MRI of the head: 1. Subcentimeter acute/early subacute infarction  centered along right superolateral central sulcus in motor and sensory cortices. No acute hemorrhage. 2. Small chronic cortical infarction in the right postcentral gyrus and within the left mid corona radiata. MRA of the head: Patent circle of Willis. No large vessel occlusion, aneurysm, or significant stenosis is identified. These results will be called to the ordering clinician or representative by the Radiologist Assistant, and communication documented in the PACS or zVision Dashboard. Electronically Signed   By: Mitzi HansenLance  Furusawa-Stratton M.D.   On: 09/29/2016 22:35   Ct Head Code Stroke Wo Contrast`  Result Date: 09/30/2016 CLINICAL DATA:  Code stroke.  Acute onset of left-sided weakness. EXAM: CT HEAD WITHOUT CONTRAST TECHNIQUE: Contiguous axial images were obtained from the base of the skull through the vertex without intravenous contrast. COMPARISON:  MRI brain 11/20/2008 FINDINGS: Brain: No acute infarct, hemorrhage, or mass lesion is present. The ventricles are of normal size. No significant extra-axial fluid collection is present. No significant white matter disease is present. The basal ganglia are intact. Insular ribbon is normal bilaterally. The brainstem and cerebellum are normal. Vascular: Minimal vascular calcifications are present without a hyperdense vessel. Skull: The calvarium is intact. No significant extracranial soft tissue injury is present. Sinuses/Orbits: The paranasal sinuses and mastoid air cells are clear. The globes and orbits are unremarkable. ASPECTS [facility]([place] Stroke Program Early CT Score) - Ganglionic level infarction (caudate, lentiform nuclei, internal capsule, insula, M1-M3 cortex): 7/7 - Supraganglionic infarction (M4-M6 cortex): 3/3 Total score (0-10 with 10 being normal): 10/10 IMPRESSION: 1. Negative CT of the head. 2. ASPECTS is 10/10 These results were called by telephone at the time of interpretation on 09/29/2016 at 4:46 pm to Dr. Ritta SlotMCNEILL KIRKPATRICK , who verbally  acknowledged these results. Electronically Signed   By: Marin Robertshristopher  Mattern M.D.   On: 09/30/2016 07:41     Medical Decision Making   Adam Andrews is a 66 y.o. male who presents with: R CVA, possible sx RICA stenosis >70%, s/p recent PCI for recent MI, reported statin intolerance vs allergy   I usually  verify the B carotid duplex findings with CTA neck when the carotid duplex is positive.  I will order the test.  If R ICA stenosis >50%, likely will need R CEA vs CAS in 2-4 weeks.  It's not clear to me if this patient's residual sx are recurrent sx vs residual sx.  If it was felt, his signs and sx were worsening, then might make the argument to proceed with intervention.  However, peri-stroke interventions have been associated with worsening CVA and hemorrhagic conversion.  Given the recent MI, the cardiac risk stratification will factor into the choice of intervention.  In the event the patient risk stratifies into high risk, would plan B carotid and cerebral angiogram to determine if candidate for CAS vs TCAR.  Ideally the Brillinta needs to be temporarily changed to Plavix to limit bleeding risk, as my experiences is that interventions on Brillinta are frequently complicated by poorly controlled bleeding. I discussed in depth with the patient the nature of atherosclerosis, and emphasized the importance of maximal medical management including strict control of blood pressure, blood glucose, and lipid levels, obtaining regular exercise, antiplatelet agents, and cessation of smoking.   The patient is currently not on a statin due to reported allergy.  It sounds as if the patient was going to be trialed on low dose Crestor.  I will defer to cardiology The patient is currently on ASA and Brillinta.  Would prefer Plavix and ASA if ok with Cardiology.  The patient is aware that without maximal medical management the underlying atherosclerotic disease process will progress, limiting the  benefit of any interventions.  Additional, decision making dependent on CTA neck and Cardiology consult.  Thank you for allowing [place] to participate in this patient's care.   Leonides Sake, MD, FACS [facility] Office: [phone] Pager: [phone]  09/30/2016, 4:49 PM

## 2016-09-30 NOTE — Progress Notes (Signed)
STROKE TEAM PROGRESS NOTE   HISTORY OF PRESENT ILLNESS (per record) Adam Andrews is an 66 y.o. male with a history of CAD, and hypertension, [facility] after he experienced a transient episode of left facial left tongue left arm paresthesia along with left arm weakness.  The patient's symptoms had resolved by the time he was examined by the neurohospitalist.  Pt noted return of L arm paresthesia while raising arms for chest xray this morning, and also reports mild left facial numbness.  MI in Feb 2018 with subsequent cath/stent, put on ASA 81mg  and ticagrelor 90mg .  Pt not taking statin last 40 days due severe bilateral calf myalgia.  CT head negative.  MRA negative.  MRI with subcentimeter acute/early subacute infarction centered along right superolateral central sulcus in motor and sensory cortices.    Date last known well: Date: 09/29/2016 Time last known well: Time: 14:00  Modified Rankin: Rankin Score=0  Patient was not administered IV t-PA secondary to low NIHSS/presenting with minimal symptoms. He was admitted to General Neurology for further evaluation and treatment.   SUBJECTIVE (INTERVAL HISTORY) His wife, daughter, and granddaughter are at the bedside.  The patient is awake, alert, and follows all commands appropriately.  Had Carotid Doppler at Dr. Jens Somrenshaw in July with 60-70% carotid stenosis.  Results are not in EHR.  Repeat carotid US today with 60-79% R ICA stenosis with significant heterogeneous plaque.  Vascular surgery consulted.    Pt not taking statin last 40 days due severe bilateral calf myalgia.  Patient started taking simvastatin in 2010 and stopped Apr 15, 2016 due to severe bilateral calf myalgias.  Physician switched him to atorvastatin Apr 16, 2016.  He reports taking atorvastatin for about one month, and then stopped taking it, again due to severe bilateral calf myalgias effecting his ability to carry out ADLs.  After one month off  atorvastatin, patient resumed taking it, and reports taking it again for one month before discontinuing it once more due to severe bilateral calf myalgias.   OBJECTIVE Temp:  [97.6 F (36.4 C)-98.6 F (37 C)] 98 F (36.7 C) (08/09 1406) Pulse Rate:  [64-83] 73 (08/09 1406) Cardiac Rhythm: Normal sinus rhythm (08/09 0718) Resp:  [10-28] 18 (08/09 1406) BP: (101-143)/(51-91) 125/64 (08/09 1406) SpO2:  [94 %-98 %] 95 % (08/09 1406) Weight:  [91.4 kg (201 lb 9.6 oz)] 91.4 kg (201 lb 9.6 oz) (08/09 0135)  CBC:   Recent Labs Lab 09/29/16 1626 09/29/16 1641 09/30/16 0431  WBC 7.0  --  6.1  NEUTROABS 4.6  --   --   HGB 13.7 13.9 13.3  HCT 39.4 41.0 38.9*  MCV 98.0  --  100.0  PLT 209  --  188    Basic Metabolic Panel:   Recent Labs Lab 09/29/16 1626 09/29/16 1641 09/30/16 0431  NA 139 141 141  K 3.9 3.9 3.5  CL 106 105 106  CO2 25  --  25  GLUCOSE 110* 108* 127*  BUN 8 10 8   CREATININE 0.90 0.80 0.85  CALCIUM 9.3  --  8.7*    Lipid Panel:     Component Value Date/Time   CHOL 169 09/30/2016 0431   TRIG 171 (H) 09/30/2016 0431   HDL 32 (L) 09/30/2016 0431   CHOLHDL 5.3 09/30/2016 0431   VLDL 34 09/30/2016 0431   LDLCALC 103 (H) 09/30/2016 0431   HgbA1c:  Lab Results  Component Value Date   HGBA1C 5.9 (H) 09/30/2016   Urine  Drug Screen:     Component Value Date/Time   LABOPIA NONE DETECTED 09/29/2016 1954   COCAINSCRNUR NONE DETECTED 09/29/2016 1954   LABBENZ NONE DETECTED 09/29/2016 1954   AMPHETMU NONE DETECTED 09/29/2016 1954   THCU NONE DETECTED 09/29/2016 1954   LABBARB NONE DETECTED 09/29/2016 1954    Alcohol Level     Component Value Date/Time   ETH <5 09/29/2016 1626    IMAGING  Dg Chest 2 View 09/30/2016 IMPRESSION: No active cardiopulmonary disease.   CT Head Wo Contrast 09/30/2016 1. Negative CT of the head. 2. ASPECTS is 10/10  Adam Andrews Contrast Adam Brain Wo Contrast 09/29/2016 IMPRESSION: MRI of the head: 1. Subcentimeter  acute/early subacute infarction centered along right superolateral central sulcus in motor and sensory cortices. No acute hemorrhage. 2. Small chronic cortical infarction in the right postcentral gyrus and within the left mid corona radiata. MRA of the head: Patent circle of Willis. No large vessel occlusion, aneurysm, or significant stenosis is identified.   TTE 09/30/2016 - No cardiac source of emboli was indentified.  EF 55-60%  Carotid US 09/30/2016 The right internal carotid artery demonstrates a 60-79% stenosis with significant heterogeneous plaque.  The left internal carotid artery demonstrates a 1-39% stenosis. The right vertebral artery appears patent and antegrade, the left vertebral appears patent but with atypical , bidirectional flow.    PHYSICAL EXAM Pleasant middle aged Caucasian male not in distress. . Afebrile. Head is nontraumatic. Neck is supple without bruit.    Cardiac exam no murmur or gallop. Lungs are clear to auscultation. Distal pulses are well felt.  Neurological Exam ;  Awake  Alert oriented x 3. Normal speech and language.eye movements full without nystagmus.fundi were not visualized. Vision acuity and fields appear normal. Hearing is normal. Palatal movements are normal. Face symmetric. Tongue midline. Normal strength, tone, reflexes and coordination except diminished fine finger movements on left and weak left grip.Marland Kitchenorbits right over left upper extremity Normal sensation. Gait deferred.  ASSESSMENT/PLAN Adam Andrews is a 66 y.o. male with history of CAD, hypertension,  presenting with transient left facial and tongue left arm paresthesia along with left arm weakness. He did not receive IV t-PA due to low NIHSS/presenting with minimal symptoms.  Stroke: Subcentimeter acute/early subacute right superolateral central sulcus infarction, lacunar, secondary to small vessel disease.    Resultant  mild left hand weakness   CT head: no acute stroke  MRI head:  Subcentimeter acute/early subacute lacunar right superolateral central sulcus infarction  MRA head: No large vessel occlusion or stenosis.  Carotid Doppler: 60-79% R ICA stenosis with significant heterogeneous plaque.  The left internal carotid artery demonstrates a 1-39% stenosis. The right vertebral artery appears patent and antegrade, the left vertebral appears patent but with atypical , bidirectional flow.  2D Echo: EF 55-60%. No source of embolus   LDL 103  HgbA1c 5.9  SCDs for VTE prophylaxis Diet Heart Room service appropriate? Yes; Fluid consistency: Thin  aspirin 81 mg daily and ticagrelor 90 mg daily prior to admission, now on aspirin 81 mg daily and ticagrelor 90 mg daily  Patient counseled to be compliant with his antithrombotic medications  Ongoing aggressive stroke risk factor management  Therapy recommendations:  none Disposition:  Home  Hypertension  Stable  Permissive hypertension (OK if < 220/120) but gradually normalize in 5-7 days  Long-term BP goal normotensive   Hyperlipidemia  Home meds: none (not taking)  LDL 103, goal < 70  Add   Continue statin  at discharge  h/o statin intolerance : Pt not taking statin last 40 days due severe bilateral calf myalgia.  Patient started taking simvastatin in 2010 and stopped Apr 15, 2016 due to severe bilateral calf myalgias.  Physician switched him to atorvastatin Apr 16, 2016.  He reports taking atorvastatin for about one month, and then stopped taking it, again due to severe bilateral calf myalgias effecting his ability to carry out ADLs.  After one month off atorvastatin, patient resumed taking it, and reports taking it again for one month before discontinuing it once more due to severe bilateral calf myalgias.    Other Stroke Risk Factors  Overweight, Body mass index is 28.93 kg/m., recommend weight loss, diet and exercise as appropriate   Other Active Problems  None   Hospital day # 0  I have  personally examined this patient, reviewed notes, independently viewed imaging studies, participated in medical decision making and plan of care.ROS completed by me personally and pertinent positives fully documented  I have made any additions or clarifications directly to the above note. Agree with note above. He has presented with left hand weakness and paresthesias due to small right subcortical infarct likely from symptomatic moderate proximal right carotid stenosis. He remains at significant risk for recurrent strokes and would benefit from elective right carotid revascularization. Recommend vascular surgery consult for elective surgery or stent in the next 1-2 weeks. Continue current dual antiplatelet therapy given recent cardiac stent. Aggressive risk factor modification. Long discussion with the patient, wife and daughter at the bedside and answered questions. Discussed with Dr. Waymon Amato. Greater than 50% time during this 35 minute visit was spent on counseling and coordination of care about his stroke, symptomatic carotid stenosis, risk reduction and treatment discussion and answered questions.  Delia Heady, MD Medical Director [facility] Pager: [phone] 09/30/2016 3:40 PM   To contact Stroke Continuity provider, please refer to [email or web address]. After hours, contact General Neurology

## 2016-09-30 NOTE — Progress Notes (Signed)
Patient c/o left hand shaking and increased weakness when he was up in bathroom attempting to bathe himself. VSS. Patient's hand no longer shaking, now mild weakness in right hand which patient had at beginning of shift. Dr. Amada JupiterKirkpatrick notified. 500cc bolus to be administered, keep patient in bed tonight.

## 2016-09-30 NOTE — Evaluation (Signed)
Speech Language Pathology Evaluation Patient Details Name: Adam BassLeonard Kjos MRN: [id number] DOB: September 11, 1950 Today's Date: 09/30/2016 Time: 0915-0930 SLP Time Calculation (min) (ACUTE ONLY): 15 min  Problem List:  Patient Active Problem List   Diagnosis Date Noted  . CAD (coronary artery disease) 09/29/2016  . TIA (transient ischemic attack) 09/29/2016  . Essential hypertension 09/29/2016   Past Medical History:  Past Medical History:  Diagnosis Date  . CAD (coronary artery disease)   . History of cardiac cath   . Hypertension    Past Surgical History:  Past Surgical History:  Procedure Laterality Date  . CARDIAC CATHETERIZATION     HPI:  66 year old male presenting with left facial and arm numbness and weakness. MRI with Subcentimeter acute/early subacute infarction centered along right superolateral central sulcus in motor and sensory cortices. No acute hemorrhage.   Assessment / Plan / Recommendation Clinical Impression  Cognitive linguistic evaluation complete with patient performing [facility]FL for all areas assessed. No SLP f/u indicated.     SLP Assessment  SLP Recommendation/Assessment: Patient does not need any further Speech Lanaguage Pathology Services SLP Visit Diagnosis: Cognitive communication deficit (R41.841)    Follow Up Recommendations  None    Frequency and Duration           SLP Evaluation Cognition  Overall Cognitive Status: Within Functional Limits for tasks assessed Orientation Level: Oriented X4       Comprehension  Auditory Comprehension Overall Auditory Comprehension: Appears within functional limits for tasks assessed Visual Recognition/Discrimination Discrimination: Within Function Limits Reading Comprehension Reading Status: Within funtional limits    Expression Expression Primary Mode of Expression: Verbal Verbal Expression Overall Verbal Expression: Appears within functional limits for tasks assessed   Oral / Motor  Oral  Motor/Sensory Function Overall Oral Motor/Sensory Function: Within functional limits Motor Speech Overall Motor Speech: Appears within functional limits for tasks assessed   GO          Functional Assessment Tool Used: skilled clinical judgement Functional Limitations: Motor speech Motor Speech Current Status [phone](G8999): 0 percent impaired, limited or restricted Motor Speech Goal Status (X9147(G9186): 0 percent impaired, limited or restricted Motor Speech Goal Status (W2956(G9158): 0 percent impaired, limited or restricted          Ferdinand LangoLeah Sumit Branham MA, CCC-SLP [phone]  Dom Haverland Meryl 09/30/2016, 9:53 AM

## 2016-09-30 NOTE — Progress Notes (Signed)
Called regarding transient worsening of his hand weakness. By the time I spoke with the nurses had resolved. It happened in the setting of being ambulatory, I asked the nurse to have him return to bed and given a 500 mL bolus.  Ritta SlotMcNeill Gitty Osterlund, MD [facility] [phone]  If 7pm- 7am, please page neurology on call as listed in AMION.

## 2016-09-30 NOTE — Progress Notes (Signed)
PT Cancellation Note  Patient Details Name: Adam Andrews MRN: [id number] DOB: Apr 16, 1950   Cancelled Treatment:    Reason Eval/Treat Not Completed: OT screened, no needs identified, will sign off.  OT screened for PT needs, no needs identified.  PT to sign off.  See OT notes for details.   Thanks,    Rollene Rotundaebecca B. Torri Langston, PT, DPT [phone]   09/30/2016, 12:31 PM

## 2016-09-30 NOTE — Evaluation (Signed)
Occupational Therapy Evaluation and Discharge Patient Details Name: Adam BassLeonard Brian MRN: [id number] DOB: [birth year] Today's Date: 09/30/2016    History of Present Illness Pt admitted with left facial left tongue left arm paresthesia along with left arm weakness. PHMx:  CAD, HTN, TIA, MI , HLD   Clinical Impression   This 66 yo male admitted with above presents to acute OT at an independent level for all basic ADLs and mobility. I made PT aware that I did not see any mobility issues with pt in hallway or stairs. No further OT needs we will sign off.     Follow Up Recommendations  No OT follow up    Equipment Recommendations  None recommended by OT       Precautions / Restrictions Precautions Precautions: None Restrictions Weight Bearing Restrictions: No      Mobility Bed Mobility Overal bed mobility: Independent                Transfers Overall transfer level: Independent               General transfer comment: Had pt ambulate in hallway with head turns and stop/turn as well as up and down 3 steps without rail--pt independent     Balance Overall balance assessment: No apparent balance deficits (not formally assessed)                                         ADL either performed or assessed with clinical judgement   ADL Overall ADL's : Independent                                             Vision Patient Visual Report: No change from baseline              Pertinent Vitals/Pain Pain Assessment: No/denies pain     Hand Dominance Right   Extremity/Trunk Assessment Upper Extremity Assessment Upper Extremity Assessment: Overall WFL for tasks assessed (pt reports intermittent weakness and numbness)   Lower Extremity Assessment Lower Extremity Assessment: Overall WFL for tasks assessed       Communication Communication Communication: No difficulties   Cognition Arousal/Alertness: Awake/alert Behavior  During Therapy: WFL for tasks assessed/performed                                          Exercises Other Exercises Other Exercises: During session pt not displaying with any functional deficits of LUE (did report intermittent numbness and weakness). Reported that MD said he needed some putty and FM exercises so I provided these to him and his wife (handout, putty-red, hand exerciser)        Home Living Family/patient expects to be discharged to:: Private residence Living Arrangements: Spouse/significant other Available Help at Discharge: Family;Available 24 hours/day Type of Home: House Home Access: Stairs to enter Entergy CorporationEntrance Stairs-Number of Steps: 2 Entrance Stairs-Rails: None Home Layout: One level     Bathroom Shower/Tub: Producer, television/film/videoWalk-in shower   Bathroom Toilet: Standard     Home Equipment: None   Additional Comments: Has steps down to pool 4 with a rail      Prior Functioning/Environment Level of Independence: Independent  OT Goals(Current goals can be found in the care plan section) Acute Rehab OT Goals Patient Stated Goal: to go home today  OT Frequency:                AM-PAC PT "6 Clicks" Daily Activity     Outcome Measure Help from another person eating meals?: None Help from another person taking care of personal grooming?: None Help from another person toileting, which includes using toliet, bedpan, or urinal?: None Help from another person bathing (including washing, rinsing, drying)?: None Help from another person to put on and taking off regular upper body clothing?: None Help from another person to put on and taking off regular lower body clothing?: None 6 Click Score: 24   End of Session Equipment Utilized During Treatment: Gait belt  Activity Tolerance: Patient tolerated treatment well Patient left:  (sitting EOB eating lunch)                   Time: 6644-0347 OT Time Calculation (min): 37  min Charges:  OT General Charges $OT Visit: 1 Procedure OT Evaluation $OT Eval Moderate Complexity: 1 Procedure OT Treatments $Therapeutic Activity: 8-22 mins G-Codes: OT G-codes **NOT FOR INPATIENT CLASS** Functional Assessment Tool Used: Clinical judgement;AM-PAC 6 Clicks Daily Activity Functional Limitation: Self care Self Care Current Status (Q2595): 0 percent impaired, limited or restricted Self Care Goal Status (G3875): 0 percent impaired, limited or restricted Self Care Discharge Status (I4332): 0 percent impaired, limited or restricted  Shayn Madole, OTR/L [phone] 09/30/2016

## 2016-09-30 NOTE — Progress Notes (Signed)
Skin assessment done by this writer, on assessment, no pressure ulcer noted

## 2016-09-30 NOTE — Progress Notes (Signed)
*  PRELIMINARY RESULTS* Vascular Ultrasound Carotid Duplex (Doppler) has been completed.  Preliminary findings: The right internal carotid artery demonstrates a 60-79% stenosis with significant heterogeneous plaque.  The left internal carotid artery demonstrates a 1-39% stenosis. The right vertebral artery appears patent and antegrade, the left vertebral appears patent but with atypical , bidirectional flow.  Chauncey FischerCharlotte C Aaren Krog 09/30/2016, 2:04 PM

## 2016-09-30 NOTE — Progress Notes (Addendum)
PROGRESS NOTE   Grayer Sproles  ZOX:[id number]    DOB: Mar 21, 1950    DOA: 09/29/2016  PCP: Salli Real, MD   I have briefly reviewed patients previous medical records in [facility].  Brief Narrative:  66 year old married male, independent of activities of daily living, PMH of CAD, PCI of his RCA in August 2010, known right carotid artery stenosis (Carotid dopplers in Dec 2011 showed a 40-59 right and a 0-39 left) being evaluated outpatient, had acute anterior MI in February 2018 and DES placed to LAD and placed on ASA and Brilinta, ischemic cardiomyopathy with LVEF 35-40 percent at that time, HTN, HLD-intolerant of simvastatin and atorvastatin-stopped 4 weeks ago with plans to start low-dose Crestor, reports right eye visual field defect while mowing his lawn 2 weeks ago (unable to see top part of visual field), presented with acute onset of left upper extremity numbness, weakness, inability to hold things and numbness of left sided angle of mouth and tongue which started at 2 PM on 09/29/16. Presented to ED and initially assessed TIA but MRI brain confirmed stroke, felt to be due to symptomatic right carotid artery stenosis. Neurology, vascular surgery consulted. Cardiology consultation for preop clearance and consideration of changing Brilinta to alternate agent for possible carotid surgery.   Assessment & Plan:   Active Problems:   CAD (coronary artery disease)   TIA (transient ischemic attack)   Essential hypertension   1. Acute right brain stroke: Resultant mild left hand weakness and numbness, improved and left angle of the mouth and tongue numbness which has resolved. Also reports right eye visual field issues which may be related to this. Neurology was consulted and he underwent stroke workup. CT head: No acute stroke. MRI head: Subcentimeter acute/early subacute lacunar right superior lateral central sulcus infarction. MRA head: No large vessel occlusion or stenosis. Carotid Doppler:  60-79 percent right ICA stenosis with significant heterogeneous plaque. The left internal carotid artery demonstrates 1-39 percent stenosis. 2-D echo: LVEF 55-60 percent. LDL 103. A1c 5.9. Patient was on aspirin 81 MG daily and Ticagrelor 90 MG bid prior to admission, continue same until vascular surgery and cardiology input. Neurology consultation appreciated. Discussed with Dr. Pearlean Brownie who recommends vascular surgery consultation for elective surgery or stent in the next 1-2 weeks. I discussed with Dr. Johny Drilling, vascular surgery who will see patient but recommends cardiology consultation for preop clearance and consideration of changing Brilinta 4 weeks prior to surgery. Therapies have no recommendation. 2. CAD status post PCI: Asymptomatic of chest pain. Management as above. 3. Essential hypertension: Allow permissive hypertension due to recent acute stroke. Remains on carvedilol. 4. Hyperlipidemia/statin intolerance: LDL 103, goal <70. Patient used to be on simvastatin for several years and then developed progressive lower extremity weakness and pain and hence discontinued February 2018. He was then started on atorvastatin in February 2018 but again developed similar complaints and was advised to stop it a month PTA with plans to consider starting low-dose Crestor. Await cardiology input but may consider starting Crestor while hospitalized. 5. Glucose intolerance/prediabetes: A1c 5.9. Diet, exercise, weight loss and close monitoring.   DVT prophylaxis: Lovenox Code Status: Full Family Communication: None at bedside Disposition: DC home possibly 8/10   Consultants:  Neurology Vascular surgery-pending Cardiology-pending   Procedures:  2-D echo 09/30/16: Study Conclusions  - Left ventricle: The cavity size was normal. There was mild   concentric hypertrophy. Systolic function was normal. The   estimated ejection fraction was in the range of 55% to 60%.  Wall   motion was normal; there were no  regional wall motion   abnormalities. Doppler parameters are consistent with abnormal   left ventricular relaxation (grade 1 diastolic dysfunction).   Doppler parameters are consistent with elevated ventricular   end-diastolic filling pressure. - Aortic valve: There was no regurgitation. - Aortic root: The aortic root was normal in size. - Mitral valve: There was no regurgitation. - Left atrium: The atrium was mildly dilated. - Right ventricle: Systolic function was normal. - Tricuspid valve: There was no regurgitation. - Pulmonary arteries: Systolic pressure was within the normal   range. - Inferior vena cava: The vessel was normal in size. - Pericardium, extracardiac: There was no pericardial effusion.  Impressions:  - No cardiac source of emboli was indentified.  Antimicrobials:  None   Subjective: Feels significantly better. No left angle of mouth or tongue numbness. Still has issues with right visual deficits. Left hand feels weird i.e. dexterity but numbness and weakness have improved.  ROS: No chest pain, palpitations, dizziness or lightheadedness.  Objective:  Vitals:   09/30/16 0819 09/30/16 1000 09/30/16 1248 09/30/16 1406  BP: 125/61 (!) 143/65 133/66 125/64  Pulse: 67 77 78 73  Resp: 16 16 15 18   Temp: 98.1 F (36.7 C) 98.1 F (36.7 C) 98.6 F (37 C) 98 F (36.7 C)  TempSrc: Oral Oral Oral Oral  SpO2: 98% 96% 96% 95%  Weight:      Height:        Examination:  General exam: Pleasant middle-aged male sitting up comfortably in bed this morning. Respiratory system: Clear to auscultation. Respiratory effort normal. Cardiovascular system: S1 & S2 heard, RRR. No JVD, murmurs, rubs, gallops or clicks. No pedal edema. Telemetry: Sinus rhythm. Gastrointestinal system: Abdomen is nondistended, soft and nontender. No organomegaly or masses felt. Normal bowel sounds heard. Central nervous system: Alert and oriented. No focal neurological deficits. Bilateral  immature cataracts. Extremities: Symmetric 5 x 5 power. No pronator drift. Skin: No rashes, lesions or ulcers Psychiatry: Judgement and insight appear normal. Mood & affect appropriate.  Neck: No carotid bruit appreciated.    Data Reviewed: I have personally reviewed following labs and imaging studies  CBC:  Recent Labs Lab 09/29/16 1626 09/29/16 1641 09/30/16 0431  WBC 7.0  --  6.1  NEUTROABS 4.6  --   --   HGB 13.7 13.9 13.3  HCT 39.4 41.0 38.9*  MCV 98.0  --  100.0  PLT 209  --  188   Basic Metabolic Panel:  Recent Labs Lab 09/29/16 1626 09/29/16 1641 09/30/16 0431  NA 139 141 141  K 3.9 3.9 3.5  CL 106 105 106  CO2 25  --  25  GLUCOSE 110* 108* 127*  BUN 8 10 8   CREATININE 0.90 0.80 0.85  CALCIUM 9.3  --  8.7*   Liver Function Tests:  Recent Labs Lab 09/29/16 1626 09/30/16 0431  AST 21 22  ALT 22 22  ALKPHOS 54 46  BILITOT 0.5 0.7  PROT 6.9 6.5  ALBUMIN 4.1 3.9   Coagulation Profile:  Recent Labs Lab 09/29/16 1626  INR 1.00   Cardiac Enzymes:  Recent Labs Lab 09/30/16 0431  TROPONINI <0.03   HbA1C:  Recent Labs  09/30/16 0431  HGBA1C 5.9*   CBG: No results for input(s): GLUCAP in the last 168 hours.  No results found for this or any previous visit (from the past 240 hour(s)).       Radiology Studies: Dg Chest 2 View  Result Date: 09/30/2016 CLINICAL DATA:  Transient ischemic attack EXAM: CHEST  2 VIEW COMPARISON:  None. FINDINGS: Minimal linear scarring or atelectasis is noted at the left costophrenic angle. No pneumonia or effusion is seen. Mediastinal and hilar contours are unremarkable. The heart is within normal limits in size. No bony abnormality is seen. IMPRESSION: No active cardiopulmonary disease. Electronically Signed   By: Dwyane Dee M.D.   On: 09/30/2016 08:05   Mr Maxine Glenn Head Wo Contrast  Result Date: 09/29/2016 CLINICAL DATA:  66 y/o M; left facial and left arm numbness with weakness. EXAM: MRI HEAD WITHOUT  CONTRAST MRA HEAD WITHOUT CONTRAST TECHNIQUE: Multiplanar, multiecho pulse sequences of the brain and surrounding structures were obtained without intravenous contrast. Angiographic images of the head were obtained using MRA technique without contrast. COMPARISON:  None. FINDINGS: MRI HEAD FINDINGS Brain: Subcentimeter foci of reduced diffusion centered around the right superolateral central sulcus in the motor and sensory cortices (series 3, image 38). No acute hemorrhage. Small chronic infarction in right postcentral gyrus an within left mid corona radiata. No focal mass effect, extra-axial collection, or hydrocephalus. Vascular: As below. Skull and upper cervical spine: Normal marrow signal. Sinuses/Orbits: Negative. Other: None. MRA HEAD FINDINGS Anterior circulation: No large vessel occlusion, aneurysm, or significant stenosis is identified. Mild irregularity of bilateral paraclinoid internal carotid arteries without significant stenosis likely representing intracranial atherosclerosis. Posterior circulation: No large vessel occlusion, aneurysm, or significant stenosis is identified. Right dominant vertebrobasilar system. Anatomic variant: Anterior communicating artery and right posterior communicating arteries are patent. Left AICA/ PICA. IMPRESSION: MRI of the head: 1. Subcentimeter acute/early subacute infarction centered along right superolateral central sulcus in motor and sensory cortices. No acute hemorrhage. 2. Small chronic cortical infarction in the right postcentral gyrus and within the left mid corona radiata. MRA of the head: Patent circle of Willis. No large vessel occlusion, aneurysm, or significant stenosis is identified. These results will be called to the ordering clinician or representative by the Radiologist Assistant, and communication documented in the PACS or zVision Dashboard. Electronically Signed   By: Mitzi Hansen M.D.   On: 09/29/2016 22:35   Mr Brain Wo  Contrast  Result Date: 09/29/2016 CLINICAL DATA:  66 y/o M; left facial and left arm numbness with weakness. EXAM: MRI HEAD WITHOUT CONTRAST MRA HEAD WITHOUT CONTRAST TECHNIQUE: Multiplanar, multiecho pulse sequences of the brain and surrounding structures were obtained without intravenous contrast. Angiographic images of the head were obtained using MRA technique without contrast. COMPARISON:  None. FINDINGS: MRI HEAD FINDINGS Brain: Subcentimeter foci of reduced diffusion centered around the right superolateral central sulcus in the motor and sensory cortices (series 3, image 38). No acute hemorrhage. Small chronic infarction in right postcentral gyrus an within left mid corona radiata. No focal mass effect, extra-axial collection, or hydrocephalus. Vascular: As below. Skull and upper cervical spine: Normal marrow signal. Sinuses/Orbits: Negative. Other: None. MRA HEAD FINDINGS Anterior circulation: No large vessel occlusion, aneurysm, or significant stenosis is identified. Mild irregularity of bilateral paraclinoid internal carotid arteries without significant stenosis likely representing intracranial atherosclerosis. Posterior circulation: No large vessel occlusion, aneurysm, or significant stenosis is identified. Right dominant vertebrobasilar system. Anatomic variant: Anterior communicating artery and right posterior communicating arteries are patent. Left AICA/ PICA. IMPRESSION: MRI of the head: 1. Subcentimeter acute/early subacute infarction centered along right superolateral central sulcus in motor and sensory cortices. No acute hemorrhage. 2. Small chronic cortical infarction in the right postcentral gyrus and within the left mid corona radiata. MRA of the  head: Patent circle of Willis. No large vessel occlusion, aneurysm, or significant stenosis is identified. These results will be called to the ordering clinician or representative by the Radiologist Assistant, and communication documented in the PACS  or zVision Dashboard. Electronically Signed   By: Mitzi HansenLance  Furusawa-Stratton M.D.   On: 09/29/2016 22:35   Ct Head Code Stroke Wo Contrast`  Result Date: 09/30/2016 CLINICAL DATA:  Code stroke.  Acute onset of left-sided weakness. EXAM: CT HEAD WITHOUT CONTRAST TECHNIQUE: Contiguous axial images were obtained from the base of the skull through the vertex without intravenous contrast. COMPARISON:  MRI brain 11/20/2008 FINDINGS: Brain: No acute infarct, hemorrhage, or mass lesion is present. The ventricles are of normal size. No significant extra-axial fluid collection is present. No significant white matter disease is present. The basal ganglia are intact. Insular ribbon is normal bilaterally. The brainstem and cerebellum are normal. Vascular: Minimal vascular calcifications are present without a hyperdense vessel. Skull: The calvarium is intact. No significant extracranial soft tissue injury is present. Sinuses/Orbits: The paranasal sinuses and mastoid air cells are clear. The globes and orbits are unremarkable. ASPECTS [facility]([place] Stroke Program Early CT Score) - Ganglionic level infarction (caudate, lentiform nuclei, internal capsule, insula, M1-M3 cortex): 7/7 - Supraganglionic infarction (M4-M6 cortex): 3/3 Total score (0-10 with 10 being normal): 10/10 IMPRESSION: 1. Negative CT of the head. 2. ASPECTS is 10/10 These results were called by telephone at the time of interpretation on 09/29/2016 at 4:46 pm to Dr. Ritta SlotMCNEILL KIRKPATRICK , who verbally acknowledged these results. Electronically Signed   By: Marin Robertshristopher  Mattern M.D.   On: 09/30/2016 07:41        Scheduled Meds: . aspirin  81 mg Oral Daily  . carvedilol  12.5 mg Oral Q breakfast  . tamsulosin  0.4 mg Oral Daily  . ticagrelor  90 mg Oral BID   Continuous Infusions: . sodium chloride Stopped (09/30/16 1217)     LOS: 0 days     Robbi Spells, MD, FACP, FHM. [facility] Pager [phone]  If 7PM-7AM, please contact  night-coverage [email or web address] Password [email or web address] 09/30/2016, 3:52 PM

## 2016-09-30 NOTE — Telephone Encounter (Signed)
Adam Andrews is calling to to get what side was the carotid doppler was done on . Adam Andrews has had a stroke and they need to know what was the results of the carotid . Please call

## 2016-09-30 NOTE — Telephone Encounter (Signed)
Left detailed message(DPR) 

## 2016-10-01 ENCOUNTER — Other Ambulatory Visit: Payer: Self-pay | Admitting: *Deleted

## 2016-10-01 DIAGNOSIS — Z01810 Encounter for preprocedural cardiovascular examination: Secondary | ICD-10-CM

## 2016-10-01 DIAGNOSIS — I6521 Occlusion and stenosis of right carotid artery: Secondary | ICD-10-CM

## 2016-10-01 NOTE — Discharge Summary (Addendum)
Physician Discharge Summary  Adam Andrews ZOX:[id number] DOB: Jul 01, 1950  PCP: Salli Real, MD  Admit date: 09/29/2016 Discharge date: 10/01/2016  Recommendations for Outpatient Follow-up:  1. Dr. Leonides Sake, Vascular Surgery: Office will coordinate outpatient follow-up for right carotid endarterectomy on Tuesday 10/05/16. 2. Dr. Olga Millers, Cardiology 3. Dr. Delia Heady, Neurology in 6 weeks. 4. Dr. Salli Real, PCP  Home Health: None Equipment/Devices: None    Discharge Condition: Improved and stable  CODE STATUS: Full  Diet recommendation: Heart healthy diet.  Discharge Diagnoses:  Active Problems:   CAD (coronary artery disease)   TIA (transient ischemic attack)   Essential hypertension   Brief Summary: 66 year old married male, independent of activities of daily living, PMH of CAD, PCI of his RCA in August 2010, known right carotid artery stenosis (Carotid dopplers in Dec 2011 showed a 40-59 right and a 0-39 left) being evaluated outpatient, had acute anterior MI in February 2018 and DES placed to LAD and placed on ASA and Brilinta, ischemic cardiomyopathy with LVEF 35-40 percent at that time, HTN, HLD-intolerant of simvastatin and atorvastatin-stopped 4 weeks ago with plans to start low-dose Crestor, reports right eye visual field defect while mowing his lawn 2 weeks ago (unable to see top part of visual field), presented with acute onset of left upper extremity numbness, weakness, inability to hold things and numbness of left sided angle of mouth and tongue which started at 2 PM on 09/29/16. Presented to ED and initially assessed TIA but MRI brain confirmed stroke, felt to be due to symptomatic right carotid artery stenosis. Neurology, vascular surgery and cardiology were consulted. At this time, [name] will be held in preparation for right carotid endarterectomy on 10/05/16.   Assessment & Plan:   1. Acute right brain stroke: Resultant mild left hand weakness and numbness,  left angle of the mouth and tongue numbness: Resolved. Also reports right eye visual field issues which may be related to this. Neurology was consulted and he underwent stroke workup. CT head: No acute stroke. MRI head: Subcentimeter acute/early subacute lacunar right superior lateral central sulcus infarction. MRA head: No large vessel occlusion or stenosis. Carotid Doppler: 60-79 percent right ICA stenosis with significant heterogeneous plaque. The left internal carotid artery demonstrates 1-39 percent stenosis. 2-D echo: LVEF 55-60 percent. LDL 103. A1c 5.9. Therapies have no recommendation. Neurology was consulted and recommended right carotid endarterectomy. Continue aspirin. Brilinta held for surgery next Tuesday. Outpatient follow-up with neurology in 6 weeks. There are concerns that patient's recurrent left hand symptoms are related to TIAs from R carotid stenosis. 2. Symptomatic right ICA stenosis: Vascular surgery input appreciated. They recommended right carotid artery endarterectomy. Cardiology was consulted for preop clearance and decision regarding holding Brilinta. Cardiology have cleared for patient to proceed with right CEA, okay to hold Brilinta for 5 days prior to procedure due to bleeding risks and then resume after procedure when able. Vascular surgery discussed with neurosurgery who were not convinced that patient's cervical CT findings account for his left hand symptoms. Also per vascular surgery, patient likely has >80-90 percent right ICA stenosis with extensive plaque versus thrombus. 3. CAD status post PCI/ischemic cardiomyopathy: Asymptomatic of chest pain. Management as above. As per cardiology, patient is close to 6 months following stent placement and hence reasonable to hold Brilinta as stated above, continue aspirin without interruption and patient and family were counseled that there is a small risk of coronary stent thrombosis. Cardiomyopathy has improved on follow-up  echocardiogram. 4. Essential hypertension:  continue carvedilol  and lisinopril. 5. Hyperlipidemia/statin intolerance: LDL 103, goal <70. Patient used to be on simvastatin for several years and then developed progressive lower extremity weakness and pain and hence discontinued February 2018. He was then started on atorvastatin in February 2018 but again developed similar complaints and was advised to stop it a month PTA with plans to consider starting low-dose Crestor. As per patient's primary cardiologist, following discharge, he will be referred to lipid clinic for consideration of Repatha. 6. Glucose intolerance/prediabetes: A1c 5.9. Diet, exercise, weight loss and close monitoring. 7. Left lung nodule: Noted on CTA neck. Will need follow-up noncontrast chest CT in 1 year.    Consultants:  Neurology Vascular surgery Cardiology  Procedures:  2-D echo 09/30/16: Study Conclusions  - Left ventricle: The cavity size was normal. There was mild concentric hypertrophy. Systolic function was normal. The estimated ejection fraction was in the range of 55% to 60%. Wall motion was normal; there were no regional wall motion abnormalities. Doppler parameters are consistent with abnormal left ventricular relaxation (grade 1 diastolic dysfunction). Doppler parameters are consistent with elevated ventricular end-diastolic filling pressure. - Aortic valve: There was no regurgitation. - Aortic root: The aortic root was normal in size. - Mitral valve: There was no regurgitation. - Left atrium: The atrium was mildly dilated. - Right ventricle: Systolic function was normal. - Tricuspid valve: There was no regurgitation. - Pulmonary arteries: Systolic pressure was within the normal range. - Inferior vena cava: The vessel was normal in size. - Pericardium, extracardiac: There was no pericardial effusion.  Impressions:  - No cardiac source of emboli was indentified.   Discharge  Instructions  Discharge Instructions    Call MD for:    Complete by:  As directed    Recurrent strokelike symptoms.   Diet - low sodium heart healthy    Complete by:  As directed    Increase activity slowly    Complete by:  As directed        Medication List    STOP taking these medications   BRILINTA 90 MG Tabs tablet Generic drug:  ticagrelor   ibuprofen 200 MG tablet Commonly known as:  ADVIL,MOTRIN     TAKE these medications   aspirin 81 MG chewable tablet Chew 81 mg by mouth daily.   carvedilol 12.5 MG tablet Commonly known as:  COREG Take 12.5 mg by mouth daily.   cholecalciferol 1000 units tablet Commonly known as:  VITAMIN D Take 1,000 Units by mouth at bedtime.   lisinopril 10 MG tablet Commonly known as:  PRINIVIL,ZESTRIL Take 10 mg by mouth daily.   tamsulosin 0.4 MG Caps capsule Commonly known as:  FLOMAX Take 0.4 mg by mouth daily as needed.      Follow-up Information    [name], MD Follow up.   Specialties:  Vascular Surgery, Cardiology Why:  Office will arrange follow up regarding surgery for next Tuesday 10/05/16. Contact information: [address] [place] [address] [phone]        Micki Riley, MD. Schedule an appointment as soon as possible for a visit in 6 week(s).   Specialties:  Neurology, Radiology Contact information: [address] [place] [address] [phone]        Lewayne Bunting, MD. Schedule an appointment as soon as possible for a visit.   Specialty:  Cardiology Contact information: [address] [place] [address] [phone]        Salli Real, MD. Schedule an appointment  as soon as possible for a visit.   Specialty:  Internal Medicine Contact information: [address] [place] [address] [phone]          No Known Allergies    Procedures/Studies: Dg Chest 2 View  Result Date: 09/30/2016 CLINICAL DATA:  Transient ischemic attack  EXAM: CHEST  2 VIEW COMPARISON:  None. FINDINGS: Minimal linear scarring or atelectasis is noted at the left costophrenic angle. No pneumonia or effusion is seen. Mediastinal and hilar contours are unremarkable. The heart is within normal limits in size. No bony abnormality is seen. IMPRESSION: No active cardiopulmonary disease. Electronically Signed   By: Dwyane Dee M.D.   On: 09/30/2016 08:05   Ct Angio Neck W Or Wo Contrast  Result Date: 10/01/2016 CLINICAL DATA:  Evaluate carotid artery disease. Carotid ultrasound demonstrating 60-79% RIGHT internal carotid artery stenosis. History of hypertension. EXAM: CT ANGIOGRAPHY NECK TECHNIQUE: Multidetector CT imaging of the neck was performed using the standard protocol during bolus administration of intravenous contrast. Multiplanar CT image reconstructions and MIPs were obtained to evaluate the vascular anatomy. Carotid stenosis measurements (when applicable) are obtained utilizing NASCET criteria, using the distal internal carotid diameter as the denominator. CONTRAST:  50 cc Isovue 370 COMPARISON:  None. FINDINGS: AORTIC ARCH: Normal appearance of the thoracic arch, normal branch pattern. Mild calcific atherosclerosis. The origins of the innominate, left Common carotid artery and subclavian artery are widely patent. RIGHT CAROTID SYSTEM: Common carotid artery is patent, coursing in a straight line fashion with mild intimal thickening. Calcific atherosclerosis and intimal thickening resulting in 70% stenosis RIGHT internal carotid artery by NASCET criteria, 2 cm segment. LEFT CAROTID SYSTEM: Common carotid artery is patent. Intimal thickening resulting in mild stenosis mid segment. Normal appearance of the carotid bifurcation without hemodynamically significant stenosis by NASCET criteria, mild atherosclerosis. Normal appearance of the included internal carotid artery. VERTEBRAL ARTERIES:Intimal thickening and calcific atherosclerosis LEFT vertebral artery  origin resulting in conclusion, thready reconstitution. Robust RIGHT vertebral artery is patent with mild extrinsic compression due to degenerative cervical spine. Mild stenosis RIGHT V4 segment due to atherosclerosis. SKELETON: No acute osseous process though bone windows have not been submitted. Moderate to severe C4-5 through C6-7 degenerative disc superimposed on congenital canal narrowing. Moderate canal stenosis C4-5 and C5-6. Severe bilateral C5-6 and LEFT C6-7 neural foraminal narrowing. OTHER NECK: Soft tissues of the neck are nonacute though, not tailored for evaluation. Mild centrilobular and paraseptal emphysema in included lung apices. 4 mm LEFT upper lobe pulmonary nodule series 5, image 158/158. Status post LEFT thyroidectomy. IMPRESSION: 1. 70% stenosis RIGHT internal carotid artery origin. 2. Occluded LEFT vertebral artery origin with thready reconstitution. 3. **An incidental finding of potential clinical significance has been found. 4 mm LEFT upper lobe pulmonary nodule. Consider follow-up CT at 12 months.** 4. Moderate canal stenosis C4-5 and C5-6. Severe C5-6 and C6-7 neural foraminal narrowing. Aortic Atherosclerosis (ICD10-I70.0) and Emphysema (ICD10-J43.9). Electronically Signed   By: Awilda Metro M.D.   On: 10/01/2016 00:01   Mr Shirlee Latch BJ Contrast  Result Date: 09/29/2016 CLINICAL DATA:  66 y/o M; left facial and left arm numbness with weakness. EXAM: MRI HEAD WITHOUT CONTRAST MRA HEAD WITHOUT CONTRAST TECHNIQUE: Multiplanar, multiecho pulse sequences of the brain and surrounding structures were obtained without intravenous contrast. Angiographic images of the head were obtained using MRA technique without contrast. COMPARISON:  None. FINDINGS: MRI HEAD FINDINGS Brain: Subcentimeter foci of reduced diffusion centered around the right superolateral central sulcus in the motor and sensory  cortices (series 3, image 38). No acute hemorrhage. Small chronic infarction in right  postcentral gyrus an within left mid corona radiata. No focal mass effect, extra-axial collection, or hydrocephalus. Vascular: As below. Skull and upper cervical spine: Normal marrow signal. Sinuses/Orbits: Negative. Other: None. MRA HEAD FINDINGS Anterior circulation: No large vessel occlusion, aneurysm, or significant stenosis is identified. Mild irregularity of bilateral paraclinoid internal carotid arteries without significant stenosis likely representing intracranial atherosclerosis. Posterior circulation: No large vessel occlusion, aneurysm, or significant stenosis is identified. Right dominant vertebrobasilar system. Anatomic variant: Anterior communicating artery and right posterior communicating arteries are patent. Left AICA/ PICA. IMPRESSION: MRI of the head: 1. Subcentimeter acute/early subacute infarction centered along right superolateral central sulcus in motor and sensory cortices. No acute hemorrhage. 2. Small chronic cortical infarction in the right postcentral gyrus and within the left mid corona radiata. MRA of the head: Patent circle of Willis. No large vessel occlusion, aneurysm, or significant stenosis is identified. These results will be called to the ordering clinician or representative by the Radiologist Assistant, and communication documented in the PACS or zVision Dashboard. Electronically Signed   By: Mitzi Hansen M.D.   On: 09/29/2016 22:35   Mr Brain Wo Contrast  Result Date: 09/29/2016 CLINICAL DATA:  66 y/o M; left facial and left arm numbness with weakness. EXAM: MRI HEAD WITHOUT CONTRAST MRA HEAD WITHOUT CONTRAST TECHNIQUE: Multiplanar, multiecho pulse sequences of the brain and surrounding structures were obtained without intravenous contrast. Angiographic images of the head were obtained using MRA technique without contrast. COMPARISON:  None. FINDINGS: MRI HEAD FINDINGS Brain: Subcentimeter foci of reduced diffusion centered around the right superolateral central  sulcus in the motor and sensory cortices (series 3, image 38). No acute hemorrhage. Small chronic infarction in right postcentral gyrus an within left mid corona radiata. No focal mass effect, extra-axial collection, or hydrocephalus. Vascular: As below. Skull and upper cervical spine: Normal marrow signal. Sinuses/Orbits: Negative. Other: None. MRA HEAD FINDINGS Anterior circulation: No large vessel occlusion, aneurysm, or significant stenosis is identified. Mild irregularity of bilateral paraclinoid internal carotid arteries without significant stenosis likely representing intracranial atherosclerosis. Posterior circulation: No large vessel occlusion, aneurysm, or significant stenosis is identified. Right dominant vertebrobasilar system. Anatomic variant: Anterior communicating artery and right posterior communicating arteries are patent. Left AICA/ PICA. IMPRESSION: MRI of the head: 1. Subcentimeter acute/early subacute infarction centered along right superolateral central sulcus in motor and sensory cortices. No acute hemorrhage. 2. Small chronic cortical infarction in the right postcentral gyrus and within the left mid corona radiata. MRA of the head: Patent circle of Willis. No large vessel occlusion, aneurysm, or significant stenosis is identified. These results will be called to the ordering clinician or representative by the Radiologist Assistant, and communication documented in the PACS or zVision Dashboard. Electronically Signed   By: Mitzi Hansen M.D.   On: 09/29/2016 22:35   Ct Head Code Stroke Wo Contrast`  Result Date: 09/30/2016 CLINICAL DATA:  Code stroke.  Acute onset of left-sided weakness. EXAM: CT HEAD WITHOUT CONTRAST TECHNIQUE: Contiguous axial images were obtained from the base of the skull through the vertex without intravenous contrast. COMPARISON:  MRI brain 11/20/2008 FINDINGS: Brain: No acute infarct, hemorrhage, or mass lesion is present. The ventricles are of normal  size. No significant extra-axial fluid collection is present. No significant white matter disease is present. The basal ganglia are intact. Insular ribbon is normal bilaterally. The brainstem and cerebellum are normal. Vascular: Minimal vascular calcifications are present without a hyperdense  vessel. Skull: The calvarium is intact. No significant extracranial soft tissue injury is present. Sinuses/Orbits: The paranasal sinuses and mastoid air cells are clear. The globes and orbits are unremarkable. ASPECTS [facility]([place] Stroke Program Early CT Score) - Ganglionic level infarction (caudate, lentiform nuclei, internal capsule, insula, M1-M3 cortex): 7/7 - Supraganglionic infarction (M4-M6 cortex): 3/3 Total score (0-10 with 10 being normal): 10/10 IMPRESSION: 1. Negative CT of the head. 2. ASPECTS is 10/10 These results were called by telephone at the time of interpretation on 09/29/2016 at 4:46 pm to Dr. Ritta SlotMCNEILL KIRKPATRICK , who verbally acknowledged these results. Electronically Signed   By: Marin Robertshristopher  Mattern M.D.   On: 09/30/2016 07:41      Subjective: Seen this morning. Overnight events noted. Reports intermittent left hand shaking and weakness. None now. No other stroke symptoms at this time. Symptoms from admission have resolved.  Discharge Exam:  Vitals:   10/01/16 0642 10/01/16 1002 10/01/16 1222 10/01/16 1414  BP: (!) 135/57 (!) 153/76 (!) 142/72 111/74  Pulse: 74 80 74 88  Resp: 20 20 19 20   Temp: 98.2 F (36.8 C) (!) 97.4 F (36.3 C) 98.1 F (36.7 C) 98 F (36.7 C)  TempSrc: Oral Oral Oral Oral  SpO2: 95% 96% 95% 96%  Weight:      Height:        General exam: Pleasant middle-aged male seen ambulating comfortably in his room. Respiratory system: Clear to auscultation. Respiratory effort normal. Cardiovascular system: S1 & S2 heard, RRR. No JVD, murmurs, rubs, gallops or clicks. No pedal edema. Telemetry: Sinus rhythm. Gastrointestinal system: Abdomen is nondistended, soft and  nontender. No organomegaly or masses felt. Normal bowel sounds heard. Central nervous system: Alert and oriented. No focal neurological deficits. Bilateral immature cataracts. Extremities: Symmetric 5 x 5 power. No pronator drift. Skin: No rashes, lesions or ulcers Psychiatry: Judgement and insight appear normal. Mood & affect appropriate.  Neck: No carotid bruit appreciated.    The results of significant diagnostics from this hospitalization (including imaging, microbiology, ancillary and laboratory) are listed below for reference.     Microbiology: No results found for this or any previous visit (from the past 240 hour(s)).   Labs: CBC:  Recent Labs Lab 09/29/16 1626 09/29/16 1641 09/30/16 0431  WBC 7.0  --  6.1  NEUTROABS 4.6  --   --   HGB 13.7 13.9 13.3  HCT 39.4 41.0 38.9*  MCV 98.0  --  100.0  PLT 209  --  188   Basic Metabolic Panel:  Recent Labs Lab 09/29/16 1626 09/29/16 1641 09/30/16 0431  NA 139 141 141  K 3.9 3.9 3.5  CL 106 105 106  CO2 25  --  25  GLUCOSE 110* 108* 127*  BUN 8 10 8   CREATININE 0.90 0.80 0.85  CALCIUM 9.3  --  8.7*   Liver Function Tests:  Recent Labs Lab 09/29/16 1626 09/30/16 0431  AST 21 22  ALT 22 22  ALKPHOS 54 46  BILITOT 0.5 0.7  PROT 6.9 6.5  ALBUMIN 4.1 3.9   Cardiac Enzymes:  Recent Labs Lab 09/30/16 0431  TROPONINI <0.03   Hgb A1c  Recent Labs  09/30/16 0431  HGBA1C 5.9*   Lipid Profile  Recent Labs  09/30/16 0431  CHOL 169  HDL 32*  LDLCALC 103*  TRIG 171*  CHOLHDL 5.3   Urinalysis    Component Value Date/Time   COLORURINE STRAW (A) 09/29/2016 1904   APPEARANCEUR CLEAR 09/29/2016 1904   LABSPEC 1.009 09/29/2016  1904   PHURINE 7.0 09/29/2016 1904   GLUCOSEU NEGATIVE 09/29/2016 1904   HGBUR NEGATIVE 09/29/2016 1904   BILIRUBINUR NEGATIVE 09/29/2016 1904   KETONESUR NEGATIVE 09/29/2016 1904   PROTEINUR NEGATIVE 09/29/2016 1904   NITRITE NEGATIVE 09/29/2016 1904   LEUKOCYTESUR  NEGATIVE 09/29/2016 1904    Discussed in detail with patient's spouse and daughter at bedside. Updated care and answered questions. Greater than 50% of the time was dedicated to counseling regarding current diagnosis, ongoing evaluation and further plans.  Time coordinating discharge: Over 30 minutes  SIGNED:  Marcellus Scott, MD, FACP, FHM. [facility] Pager [phone]  If 7PM-7AM, please contact night-coverage [email or web address] Password [facility] 10/01/2016, 4:38 PM

## 2016-10-01 NOTE — Progress Notes (Addendum)
Daily Progress Note   Assessment/Planning:   R CVA, likely sx R ICA stenosis >70%, possible spinal stenosis   Needs Spinal c/s for opinion on whether L hand sx are related to CT findings of DDD, spine and foraminal stenoses  If spine does not feel spinal pathology accounts for this patient's recurrent sx, will have to consider possible recurrent sx as TIA  If his sx are TIA, would proceed with intervention by Tuesday at the earliest   Needs Cardio c/s to evaluate risk stratification for R CEA vs CAS.  Will need to be off Brillinta in case R CEA elected.  Will determine final plan once these two consults completed.  My team has called the appropriate specialists to obtain their opnion.   Subjective    Repeat episode of L arm weakness with paraesthesia, both episodes yesterday with arm use   Objective   Vitals:   09/30/16 1635 09/30/16 2044 10/01/16 0054 10/01/16 0642  BP: 131/78 (!) 137/54 (!) 137/57 (!) 135/57  Pulse: 77 74 75 74  Resp: 18 20 20 20   Temp: 98 F (36.7 C) 98.5 F (36.9 C) 98.5 F (36.9 C) 98.2 F (36.8 C)  TempSrc: Oral Oral Oral Oral  SpO2: 96% 97% 95% 95%  Weight:      Height:         Intake/Output Summary (Last 24 hours) at 10/01/16 0816 Last data filed at 09/30/16 2045  Gross per 24 hour  Intake              240 ml  Output                0 ml  Net              240 ml    NEURO Unchanged, mildly weaker L hand grip   Radiology     Dg Chest 2 View  Result Date: 09/30/2016 CLINICAL DATA:  Transient ischemic attack EXAM: CHEST  2 VIEW COMPARISON:  None. FINDINGS: Minimal linear scarring or atelectasis is noted at the left costophrenic angle. No pneumonia or effusion is seen. Mediastinal and hilar contours are unremarkable. The heart is within normal limits in size. No bony abnormality is seen. IMPRESSION: No active cardiopulmonary disease. Electronically Signed   By: Dwyane DeePaul  Barry M.D.   On: 09/30/2016 08:05   Ct Angio Neck W Or Wo  Contrast  Result Date: 10/01/2016 CLINICAL DATA:  Evaluate carotid artery disease. Carotid ultrasound demonstrating 60-79% RIGHT internal carotid artery stenosis. History of hypertension. EXAM: CT ANGIOGRAPHY NECK TECHNIQUE: Multidetector CT imaging of the neck was performed using the standard protocol during bolus administration of intravenous contrast. Multiplanar CT image reconstructions and MIPs were obtained to evaluate the vascular anatomy. Carotid stenosis measurements (when applicable) are obtained utilizing NASCET criteria, using the distal internal carotid diameter as the denominator. CONTRAST:  50 cc Isovue 370 COMPARISON:  None. FINDINGS: AORTIC ARCH: Normal appearance of the thoracic arch, normal branch pattern. Mild calcific atherosclerosis. The origins of the innominate, left Common carotid artery and subclavian artery are widely patent. RIGHT CAROTID SYSTEM: Common carotid artery is patent, coursing in a straight line fashion with mild intimal thickening. Calcific atherosclerosis and intimal thickening resulting in 70% stenosis RIGHT internal carotid artery by NASCET criteria, 2 cm segment. LEFT CAROTID SYSTEM: Common carotid artery is patent. Intimal thickening resulting in mild stenosis mid segment. Normal appearance of the carotid bifurcation without hemodynamically significant stenosis by NASCET criteria, mild atherosclerosis. Normal appearance of the included  internal carotid artery. VERTEBRAL ARTERIES:Intimal thickening and calcific atherosclerosis LEFT vertebral artery origin resulting in conclusion, thready reconstitution. Robust RIGHT vertebral artery is patent with mild extrinsic compression due to degenerative cervical spine. Mild stenosis RIGHT V4 segment due to atherosclerosis. SKELETON: No acute osseous process though bone windows have not been submitted. Moderate to severe C4-5 through C6-7 degenerative disc superimposed on congenital canal narrowing. Moderate canal stenosis C4-5 and  C5-6. Severe bilateral C5-6 and LEFT C6-7 neural foraminal narrowing. OTHER NECK: Soft tissues of the neck are nonacute though, not tailored for evaluation. Mild centrilobular and paraseptal emphysema in included lung apices. 4 mm LEFT upper lobe pulmonary nodule series 5, image 158/158. Status post LEFT thyroidectomy. IMPRESSION: 1. 70% stenosis RIGHT internal carotid artery origin. 2. Occluded LEFT vertebral artery origin with thready reconstitution. 3. **An incidental finding of potential clinical significance has been found. 4 mm LEFT upper lobe pulmonary nodule. Consider follow-up CT at 12 months.** 4. Moderate canal stenosis C4-5 and C5-6. Severe C5-6 and C6-7 neural foraminal narrowing. Aortic Atherosclerosis (ICD10-I70.0) and Emphysema (ICD10-J43.9). Electronically Signed   By: Awilda Metro M.D.   On: 10/01/2016 00:01   Mr Shirlee Latch ZO Contrast  Result Date: 09/29/2016 CLINICAL DATA:  66 y/o M; left facial and left arm numbness with weakness. EXAM: MRI HEAD WITHOUT CONTRAST MRA HEAD WITHOUT CONTRAST TECHNIQUE: Multiplanar, multiecho pulse sequences of the brain and surrounding structures were obtained without intravenous contrast. Angiographic images of the head were obtained using MRA technique without contrast. COMPARISON:  None. FINDINGS: MRI HEAD FINDINGS Brain: Subcentimeter foci of reduced diffusion centered around the right superolateral central sulcus in the motor and sensory cortices (series 3, image 38). No acute hemorrhage. Small chronic infarction in right postcentral gyrus an within left mid corona radiata. No focal mass effect, extra-axial collection, or hydrocephalus. Vascular: As below. Skull and upper cervical spine: Normal marrow signal. Sinuses/Orbits: Negative. Other: None. MRA HEAD FINDINGS Anterior circulation: No large vessel occlusion, aneurysm, or significant stenosis is identified. Mild irregularity of bilateral paraclinoid internal carotid arteries without significant  stenosis likely representing intracranial atherosclerosis. Posterior circulation: No large vessel occlusion, aneurysm, or significant stenosis is identified. Right dominant vertebrobasilar system. Anatomic variant: Anterior communicating artery and right posterior communicating arteries are patent. Left AICA/ PICA. IMPRESSION: MRI of the head: 1. Subcentimeter acute/early subacute infarction centered along right superolateral central sulcus in motor and sensory cortices. No acute hemorrhage. 2. Small chronic cortical infarction in the right postcentral gyrus and within the left mid corona radiata. MRA of the head: Patent circle of Willis. No large vessel occlusion, aneurysm, or significant stenosis is identified. These results will be called to the ordering clinician or representative by the Radiologist Assistant, and communication documented in the PACS or zVision Dashboard. Electronically Signed   By: Mitzi Hansen M.D.   On: 09/29/2016 22:35   Mr Brain Wo Contrast  Result Date: 09/29/2016 CLINICAL DATA:  66 y/o M; left facial and left arm numbness with weakness. EXAM: MRI HEAD WITHOUT CONTRAST MRA HEAD WITHOUT CONTRAST TECHNIQUE: Multiplanar, multiecho pulse sequences of the brain and surrounding structures were obtained without intravenous contrast. Angiographic images of the head were obtained using MRA technique without contrast. COMPARISON:  None. FINDINGS: MRI HEAD FINDINGS Brain: Subcentimeter foci of reduced diffusion centered around the right superolateral central sulcus in the motor and sensory cortices (series 3, image 38). No acute hemorrhage. Small chronic infarction in right postcentral gyrus an within left mid corona radiata. No focal mass effect, extra-axial collection, or  hydrocephalus. Vascular: As below. Skull and upper cervical spine: Normal marrow signal. Sinuses/Orbits: Negative. Other: None. MRA HEAD FINDINGS Anterior circulation: No large vessel occlusion, aneurysm, or  significant stenosis is identified. Mild irregularity of bilateral paraclinoid internal carotid arteries without significant stenosis likely representing intracranial atherosclerosis. Posterior circulation: No large vessel occlusion, aneurysm, or significant stenosis is identified. Right dominant vertebrobasilar system. Anatomic variant: Anterior communicating artery and right posterior communicating arteries are patent. Left AICA/ PICA. IMPRESSION: MRI of the head: 1. Subcentimeter acute/early subacute infarction centered along right superolateral central sulcus in motor and sensory cortices. No acute hemorrhage. 2. Small chronic cortical infarction in the right postcentral gyrus and within the left mid corona radiata. MRA of the head: Patent circle of Willis. No large vessel occlusion, aneurysm, or significant stenosis is identified. These results will be called to the ordering clinician or representative by the Radiologist Assistant, and communication documented in the PACS or zVision Dashboard. Electronically Signed   By: Mitzi Hansen M.D.   On: 09/29/2016 22:35   Ct Head Code Stroke Wo Contrast`  Result Date: 09/30/2016 CLINICAL DATA:  Code stroke.  Acute onset of left-sided weakness. EXAM: CT HEAD WITHOUT CONTRAST TECHNIQUE: Contiguous axial images were obtained from the base of the skull through the vertex without intravenous contrast. COMPARISON:  MRI brain 11/20/2008 FINDINGS: Brain: No acute infarct, hemorrhage, or mass lesion is present. The ventricles are of normal size. No significant extra-axial fluid collection is present. No significant white matter disease is present. The basal ganglia are intact. Insular ribbon is normal bilaterally. The brainstem and cerebellum are normal. Vascular: Minimal vascular calcifications are present without a hyperdense vessel. Skull: The calvarium is intact. No significant extracranial soft tissue injury is present. Sinuses/Orbits: The paranasal sinuses  and mastoid air cells are clear. The globes and orbits are unremarkable. ASPECTS [facility]: [facility] Stroke Program Early CT Score) - Ganglionic level infarction (caudate, lentiform nuclei, internal capsule, insula, M1-M3 cortex): 7/7 - Supraganglionic infarction (M4-M6 cortex): 3/3 Total score (0-10 with 10 being normal): 10/10 IMPRESSION: 1. Negative CT of the head. 2. ASPECTS is 10/10 These results were called by telephone at the time of interpretation on 09/29/2016 at 4:46 pm to Dr. Ritta Slot , who verbally acknowledged these results. Electronically Signed   By: Marin Roberts M.D.   On: 09/30/2016 07:41   I reviewed the neck CTA and I suspect the R ICA stenosis is significantly higher than 70%, especially on the cross-sections.  The 70% stenosis was computed on reconstructions.  There is significant plaque vs thrombus load at the proximal internal carotid artery.  The artery appears to be surgically accessible.   Laboratory   CBC CBC Latest Ref Rng & Units 09/30/2016 09/29/2016 09/29/2016  WBC 4.0 - 10.5 K/uL 6.1 - 7.0  Hemoglobin 13.0 - 17.0 g/dL 16.1 09.6 04.5  Hematocrit 39.0 - 52.0 % 38.9(L) 41.0 39.4  Platelets 150 - 400 K/uL 188 - 209    BMET    Component Value Date/Time   NA 141 09/30/2016 0431   K 3.5 09/30/2016 0431   CL 106 09/30/2016 0431   CO2 25 09/30/2016 0431   GLUCOSE 127 (H) 09/30/2016 0431   BUN 8 09/30/2016 0431   CREATININE 0.85 09/30/2016 0431   CALCIUM 8.7 (L) 09/30/2016 0431   GFRNONAA >60 09/30/2016 0431   GFRAA >60 09/30/2016 0431     Leonides Sake, MD, FACS [facility] Office: [phone] Pager: [phone]  10/01/2016, 8:16 AM

## 2016-10-01 NOTE — Progress Notes (Addendum)
   Daily Progress Note   See Dr. Ludwig Clarksrenshaw's note.  From cardiac viewpoint, ok to proceed with R CEA.  CT was reviewed with Dr. Danielle DessElsner, Neurosurgery, who isn't convinced the cervical CT findings account for his L hand sx.  - I have concerns that the recurrent L hand sx represent additional evenst.  On the cross-sectional imaging of the R ICA, the stenosis is >80-90% with extensive plaque vs thrombus, so there is a reasonable probability that the lesion is the responsible culprit. - discussed with pt stopping Brillinta - Preop Anes clinic on Monday - R CEA on Tuesday - From my viewpoint, pt does not need to stay in patient.   Leonides SakeBrian Marykay Mccleod, MD, FACS [facility] [place] Office: [phone] Pager: [phone]  10/01/2016, 3:59 PM

## 2016-10-01 NOTE — Progress Notes (Signed)
Discharge orders received.  Discharge instructions and follow-up appointments reviewed with the patient.  VSS upon discharge.  IV removed and education complete.  All belongings sent with the patient.  Walked out to the main entrance.   Caty Tessler M, RN 

## 2016-10-01 NOTE — Discharge Instructions (Signed)

## 2016-10-01 NOTE — Consult Note (Signed)
Cardiology Consultation:   Patient ID: Adam Andrews; [id number]; 1950-07-05   Admit date: 09/29/2016 Date of Consult: 10/01/2016  Primary Care Provider: Salli RealSun, Yun, MD Primary Cardiologist: Dr. Jens Somrenshaw Primary Electrophysiologist:  None   Patient Profile:   Patients charts are pending merge.  Adam Andrews is a 66 y.o. male with a hx of  PCI to the RCA in August 2010. Carotid dopplers in Dec 2011 with 40-59 right and 0-39 left with recommendation for follow-up in 1 year. Office visit in January 2017 he declined follow-up exercise treadmill, carotid dopplers and echocardiogram due to cost. He was admitted February 2018 for an acute anterior infarct with a 99% proximal LAD treated with a DES. His echocardiogram showed an EF of 35-40% and grade 1 dd. He last saw Dr. Jens Somrenshaw in the office on 08/19/2016; carotid dopplers ordered (7/28) 60-79% and left 1-39%.   He is being seen today for the evaluation of pre operative clearance at the request of Dr. Waymon AmatoHongalgi.  History of Present Illness:   [name] presented to the ER with left facial, tongue and arm paresthesia with associated weakness. He has been seen by Dr. Imogene Burnhen with vascular and vein specialists and diagnosed with R CVA, likely sx of R ICA stenosis > 70%, possible spinal stenosis. He is pending spinal consult.  Dr. Imogene Burnhen is requestion cardiac risk stratification for treatment of his carotid stenosis (Please see Dr. Layla Mawhens consult note from 8/9). The patient had a stent placed February of this year but would need to be of the Brilinta if R CEA is elected. The plan is for the surgeon to decided his final plan once we and the spinal consult have voiced our opinions.  He is currently overall frustrated to be in this situation. But he will say that despite some positional dizziness he has been overall doing very well since his stent was placed. He has had some mild shortness of breath which he says is related to the Brilinta. He has been  very active in the hot heat, moving furniture and being active without cp or exacerbation of shortness of breath. His family who is present agrees with this.   Past Medical History:  Diagnosis Date  . CAD (coronary artery disease)   . History of cardiac cath   . Hypertension     Past Surgical History:  Procedure Laterality Date  . CARDIAC CATHETERIZATION       Inpatient Medications: Scheduled Meds: . aspirin  81 mg Oral Daily  . carvedilol  12.5 mg Oral Q breakfast  . tamsulosin  0.4 mg Oral Daily  . ticagrelor  90 mg Oral BID   Continuous Infusions:  PRN Meds: acetaminophen **OR** acetaminophen (TYLENOL) oral liquid 160 mg/5 mL **OR** acetaminophen, ALPRAZolam, senna-docusate  Allergies:   No Known Allergies  Social History:   Social History   Social History  . Marital status: Married    Spouse name: N/A  . Number of children: N/A  . Years of education: N/A   Occupational History  . Not on file.   Social History Main Topics  . Smoking status: Former Games developermoker  . Smokeless tobacco: Never Used  . Alcohol use Not on file  . Drug use: Unknown  . Sexual activity: Not on file   Other Topics Concern  . Not on file   Social History Narrative  . No narrative on file    Family History:   Family History  Problem Relation Age of Onset  . CAD Sister   .  CAD Brother   . Stroke Brother      ROS:  Please see the history of present illness.  ROS  All other ROS reviewed and negative.     Physical Exam/Data:   Vitals:   09/30/16 1635 09/30/16 2044 10/01/16 0054 10/01/16 0642  BP: 131/78 (!) 137/54 (!) 137/57 (!) 135/57  Pulse: 77 74 75 74  Resp: 18 20 20 20   Temp: 98 F (36.7 C) 98.5 F (36.9 C) 98.5 F (36.9 C) 98.2 F (36.8 C)  TempSrc: Oral Oral Oral Oral  SpO2: 96% 97% 95% 95%  Weight:      Height:        Intake/Output Summary (Last 24 hours) at 10/01/16 0923 Last data filed at 09/30/16 2045  Gross per 24 hour  Intake              240 ml  Output                 0 ml  Net              240 ml   Filed Weights   09/30/16 0135  Weight: 201 lb 9.6 oz (91.4 kg)   Body mass index is 28.93 kg/m.  General:  Well nourished, well developed, in no acute distress HEENT: normal Lymph: no adenopathy Neck: no JVD Endocrine:  No thryomegaly Vascular: + right carotid bruit Cardiac:  normal S1, S2; RRR; no murmur Lungs:  clear to auscultation bilaterally, no wheezing, rhonchi or rales  Abd: soft, nontender, no hepatomegaly  Ext: no edema Musculoskeletal:  No deformities, BUE and BLE strength normal and equal Skin: warm and dry  Neuro:  CNs 2-12 intact, no focal abnormalities noted Psych:  Normal affect   EKG:  The EKG was personally reviewed and demonstrates:  Sinus rhythm rate is 60  Relevant CV Studies:  Echocardiogram 09/30/2016  Study Conclusions  - Left ventricle: The cavity size was normal. There was mild   concentric hypertrophy. Systolic function was normal. The   estimated ejection fraction was in the range of 55% to 60%. Wall   motion was normal; there were no regional wall motion   abnormalities. Doppler parameters are consistent with abnormal   left ventricular relaxation (grade 1 diastolic dysfunction).   Doppler parameters are consistent with elevated ventricular   end-diastolic filling pressure. - Aortic valve: There was no regurgitation. - Aortic root: The aortic root was normal in size. - Mitral valve: There was no regurgitation. - Left atrium: The atrium was mildly dilated. - Right ventricle: Systolic function was normal. - Tricuspid valve: There was no regurgitation. - Pulmonary arteries: Systolic pressure was within the normal   range. - Inferior vena cava: The vessel was normal in size. - Pericardium, extracardiac: There was no pericardial effusion.  Impressions:  - No cardiac source of emboli was indentified.  Cardiac Catheterization April 15, 2016  Conclusion     Patent stent in the  RCA.  Prox LAD lesion, 99 %stenosed, thrombotic, culprit lesion. A STENT SYNERGY DES 3X20 drug eluting stent was successfully placed. and post dilated to 3.75 mm proximally.  Post intervention, there is a 0% residual stenosis.  LV end diastolic pressure is moderately elevated.   Continue dual antiplatelet therapy for at least a year.  Would use Brilinta if there are no bleeding contraindications.  Consider longer antiplatelet therapy with clopidogrel.  Continue tirofiban for 1 hour post procedure.    He will need aggressive secondary prevention.  Change statin  to higher potency, atorvastatin 80 mg daily. Continue medical therapy for his cardiomyopathy.        Laboratory Data:  Chemistry Recent Labs Lab 09/29/16 1626 09/29/16 1641 09/30/16 0431  NA 139 141 141  K 3.9 3.9 3.5  CL 106 105 106  CO2 25  --  25  GLUCOSE 110* 108* 127*  BUN 8 10 8   CREATININE 0.90 0.80 0.85  CALCIUM 9.3  --  8.7*  GFRNONAA >60  --  >60  GFRAA >60  --  >60  ANIONGAP 8  --  10     Recent Labs Lab 09/29/16 1626 09/30/16 0431  PROT 6.9 6.5  ALBUMIN 4.1 3.9  AST 21 22  ALT 22 22  ALKPHOS 54 46  BILITOT 0.5 0.7   Hematology Recent Labs Lab 09/29/16 1626 09/29/16 1641 09/30/16 0431  WBC 7.0  --  6.1  RBC 4.02*  --  3.89*  HGB 13.7 13.9 13.3  HCT 39.4 41.0 38.9*  MCV 98.0  --  100.0  MCH 34.1*  --  34.2*  MCHC 34.8  --  34.2  RDW 13.7  --  14.2  PLT 209  --  188   Cardiac Enzymes Recent Labs Lab 09/30/16 0431  TROPONINI <0.03    Recent Labs Lab 09/29/16 1639  TROPIPOC 0.00    BNPNo results for input(s): BNP, PROBNP in the last 168 hours.  DDimer No results for input(s): DDIMER in the last 168 hours.  Radiology/Studies:  Dg Chest 2 View  Result Date: 09/30/2016 CLINICAL DATA:  Transient ischemic attack EXAM: CHEST  2 VIEW COMPARISON:  None. FINDINGS: Minimal linear scarring or atelectasis is noted at the left costophrenic angle. No pneumonia or effusion is seen.  Mediastinal and hilar contours are unremarkable. The heart is within normal limits in size. No bony abnormality is seen. IMPRESSION: No active cardiopulmonary disease. Electronically Signed   By: Dwyane Dee M.D.   On: 09/30/2016 08:05   Ct Angio Neck W Or Wo Contrast  Result Date: 10/01/2016 CLINICAL DATA:  Evaluate carotid artery disease. Carotid ultrasound demonstrating 60-79% RIGHT internal carotid artery stenosis. History of hypertension. EXAM: CT ANGIOGRAPHY NECK TECHNIQUE: Multidetector CT imaging of the neck was performed using the standard protocol during bolus administration of intravenous contrast. Multiplanar CT image reconstructions and MIPs were obtained to evaluate the vascular anatomy. Carotid stenosis measurements (when applicable) are obtained utilizing NASCET criteria, using the distal internal carotid diameter as the denominator. CONTRAST:  50 cc Isovue 370 COMPARISON:  None. FINDINGS: AORTIC ARCH: Normal appearance of the thoracic arch, normal branch pattern. Mild calcific atherosclerosis. The origins of the innominate, left Common carotid artery and subclavian artery are widely patent. RIGHT CAROTID SYSTEM: Common carotid artery is patent, coursing in a straight line fashion with mild intimal thickening. Calcific atherosclerosis and intimal thickening resulting in 70% stenosis RIGHT internal carotid artery by NASCET criteria, 2 cm segment. LEFT CAROTID SYSTEM: Common carotid artery is patent. Intimal thickening resulting in mild stenosis mid segment. Normal appearance of the carotid bifurcation without hemodynamically significant stenosis by NASCET criteria, mild atherosclerosis. Normal appearance of the included internal carotid artery. VERTEBRAL ARTERIES:Intimal thickening and calcific atherosclerosis LEFT vertebral artery origin resulting in conclusion, thready reconstitution. Robust RIGHT vertebral artery is patent with mild extrinsic compression due to degenerative cervical spine. Mild  stenosis RIGHT V4 segment due to atherosclerosis. SKELETON: No acute osseous process though bone windows have not been submitted. Moderate to severe C4-5 through C6-7 degenerative disc superimposed on congenital  canal narrowing. Moderate canal stenosis C4-5 and C5-6. Severe bilateral C5-6 and LEFT C6-7 neural foraminal narrowing. OTHER NECK: Soft tissues of the neck are nonacute though, not tailored for evaluation. Mild centrilobular and paraseptal emphysema in included lung apices. 4 mm LEFT upper lobe pulmonary nodule series 5, image 158/158. Status post LEFT thyroidectomy. IMPRESSION: 1. 70% stenosis RIGHT internal carotid artery origin. 2. Occluded LEFT vertebral artery origin with thready reconstitution. 3. **An incidental finding of potential clinical significance has been found. 4 mm LEFT upper lobe pulmonary nodule. Consider follow-up CT at 12 months.** 4. Moderate canal stenosis C4-5 and C5-6. Severe C5-6 and C6-7 neural foraminal narrowing. Aortic Atherosclerosis (ICD10-I70.0) and Emphysema (ICD10-J43.9). Electronically Signed   By: Awilda Metro M.D.   On: 10/01/2016 00:01   Mr Shirlee Latch ZO Contrast  Result Date: 09/29/2016 CLINICAL DATA:  66 y/o M; left facial and left arm numbness with weakness. EXAM: MRI HEAD WITHOUT CONTRAST MRA HEAD WITHOUT CONTRAST TECHNIQUE: Multiplanar, multiecho pulse sequences of the brain and surrounding structures were obtained without intravenous contrast. Angiographic images of the head were obtained using MRA technique without contrast. COMPARISON:  None. FINDINGS: MRI HEAD FINDINGS Brain: Subcentimeter foci of reduced diffusion centered around the right superolateral central sulcus in the motor and sensory cortices (series 3, image 38). No acute hemorrhage. Small chronic infarction in right postcentral gyrus an within left mid corona radiata. No focal mass effect, extra-axial collection, or hydrocephalus. Vascular: As below. Skull and upper cervical spine: Normal  marrow signal. Sinuses/Orbits: Negative. Other: None. MRA HEAD FINDINGS Anterior circulation: No large vessel occlusion, aneurysm, or significant stenosis is identified. Mild irregularity of bilateral paraclinoid internal carotid arteries without significant stenosis likely representing intracranial atherosclerosis. Posterior circulation: No large vessel occlusion, aneurysm, or significant stenosis is identified. Right dominant vertebrobasilar system. Anatomic variant: Anterior communicating artery and right posterior communicating arteries are patent. Left AICA/ PICA. IMPRESSION: MRI of the head: 1. Subcentimeter acute/early subacute infarction centered along right superolateral central sulcus in motor and sensory cortices. No acute hemorrhage. 2. Small chronic cortical infarction in the right postcentral gyrus and within the left mid corona radiata. MRA of the head: Patent circle of Willis. No large vessel occlusion, aneurysm, or significant stenosis is identified. These results will be called to the ordering clinician or representative by the Radiologist Assistant, and communication documented in the PACS or zVision Dashboard. Electronically Signed   By: Mitzi Hansen M.D.   On: 09/29/2016 22:35   Mr Brain Wo Contrast  Result Date: 09/29/2016 CLINICAL DATA:  65 y/o M; left facial and left arm numbness with weakness. EXAM: MRI HEAD WITHOUT CONTRAST MRA HEAD WITHOUT CONTRAST TECHNIQUE: Multiplanar, multiecho pulse sequences of the brain and surrounding structures were obtained without intravenous contrast. Angiographic images of the head were obtained using MRA technique without contrast. COMPARISON:  None. FINDINGS: MRI HEAD FINDINGS Brain: Subcentimeter foci of reduced diffusion centered around the right superolateral central sulcus in the motor and sensory cortices (series 3, image 38). No acute hemorrhage. Small chronic infarction in right postcentral gyrus an within left mid corona radiata. No  focal mass effect, extra-axial collection, or hydrocephalus. Vascular: As below. Skull and upper cervical spine: Normal marrow signal. Sinuses/Orbits: Negative. Other: None. MRA HEAD FINDINGS Anterior circulation: No large vessel occlusion, aneurysm, or significant stenosis is identified. Mild irregularity of bilateral paraclinoid internal carotid arteries without significant stenosis likely representing intracranial atherosclerosis. Posterior circulation: No large vessel occlusion, aneurysm, or significant stenosis is identified. Right dominant vertebrobasilar system. Anatomic variant:  Anterior communicating artery and right posterior communicating arteries are patent. Left AICA/ PICA. IMPRESSION: MRI of the head: 1. Subcentimeter acute/early subacute infarction centered along right superolateral central sulcus in motor and sensory cortices. No acute hemorrhage. 2. Small chronic cortical infarction in the right postcentral gyrus and within the left mid corona radiata. MRA of the head: Patent circle of Willis. No large vessel occlusion, aneurysm, or significant stenosis is identified. These results will be called to the ordering clinician or representative by the Radiologist Assistant, and communication documented in the PACS or zVision Dashboard. Electronically Signed   By: Mitzi Hansen M.D.   On: 09/29/2016 22:35   Ct Head Code Stroke Wo Contrast`  Result Date: 09/30/2016 CLINICAL DATA:  Code stroke.  Acute onset of left-sided weakness. EXAM: CT HEAD WITHOUT CONTRAST TECHNIQUE: Contiguous axial images were obtained from the base of the skull through the vertex without intravenous contrast. COMPARISON:  MRI brain 11/20/2008 FINDINGS: Brain: No acute infarct, hemorrhage, or mass lesion is present. The ventricles are of normal size. No significant extra-axial fluid collection is present. No significant white matter disease is present. The basal ganglia are intact. Insular ribbon is normal bilaterally.  The brainstem and cerebellum are normal. Vascular: Minimal vascular calcifications are present without a hyperdense vessel. Skull: The calvarium is intact. No significant extracranial soft tissue injury is present. Sinuses/Orbits: The paranasal sinuses and mastoid air cells are clear. The globes and orbits are unremarkable. ASPECTS [facility] Stroke Program Early CT Score) - Ganglionic level infarction (caudate, lentiform nuclei, internal capsule, insula, M1-M3 cortex): 7/7 - Supraganglionic infarction (M4-M6 cortex): 3/3 Total score (0-10 with 10 being normal): 10/10 IMPRESSION: 1. Negative CT of the head. 2. ASPECTS is 10/10 These results were called by telephone at the time of interpretation on 09/29/2016 at 4:46 pm to Dr. Ritta Slot , who verbally acknowledged these results. Electronically Signed   By: Marin Roberts M.D.   On: 09/30/2016 07:41    Assessment and Plan:   1. R CVA with right carotid stenosis: > 70% stenosis to the right carotid artery with the plan for intervention. The patient had a stent placed in 03/2016 and is currently on Brilinta, he would need to stop this for the procedure in which there would be some risk.  He has been having come mild shortness of breath that he associates with his Brilinta. He has experienced some mild positional dizziness which is likely related to his carotid stenosis.  2. Hypertension:  Controlled  3. CAD s/p PCI with DES to right RCA:  He has been complaint with his medications and not experiencing any anginal symptoms.      Signed, Dorthula Matas, PA-C  10/01/2016  As above, patient seen and examined. Briefly he is a 66 year old male with past medical history of coronary artery disease, ischemic cardiomyopathy, hypertension, hyperlipidemia admitted with a CVA. He will require carotid endarterectomy and cardiology asked to evaluate preoperatively. Patient has had previous PCI of his right coronary artery. On 04/15/2016 he presented with  an anterior myocardial infarction and had PCI of his LAD with drug-eluting stent. Patient had repeat echocardiogram yesterday that shows normal LV systolic function and mild left atrial enlargement. Patient was admitted with a CVA and will require carotid endarterectomy and we were asked to evaluate preoperatively. Patient is very active and denies dyspnea on exertion, orthopnea, PND, syncope or exertional chest pain. Electrocardiogram shows sinus rhythm with no ST changes.   1 preoperative evaluation prior to carotid endarterectomy-patient is now near  6 months following his PCI on February 22. He has excellent functional capacity and denies dyspnea or exertional chest pain. He may proceed without further cardiac evaluation. Dr Claudie Fisherman would like him off brilinta prior to procedure due to risk of bleeding. Given that we are close to 6 months following stent placement I feel we can hold this medication for 5 days prior to the procedure and then resume after. He must continue aspirin without interruption. I have explained to the family and patient that there is a small risk of stent thrombosis.  2 recent stroke-continue aspirin. Patient is for carotid endarterectomy. Followed by neurology.  3 ischemic cardiomyopathy-improved on follow-up echocardiogram. Continue carvedilol. Lisinopril is on hold to allow permissive hypertension given recent CVA. Would resume at discharge.  4 hyperlipidemia-patient has had myalgias with Zocor and Lipitor. Following discharge will refer to lipid clinic for consideration of repatha.   5 left lung nodule-noted on neck CTA. He will need follow-up noncontrast chest CT in one year.  6 hypertension-continue carvedilol. Lisinopril on hold to allow permissive hypertension given recent CVA.   Olga Millers, MD

## 2016-10-01 NOTE — Care Management Note (Signed)
Case Management Note  Patient Details  Name: Olive BassLeonard Meath MRN: [id number] Date of Birth: September 22, 1950  Subjective/Objective:   Pt admitted with CVA. He is from home with spouse.                  Action/Plan: No f/u and no DME needs per PT/OT. CM following for further d/c needs, physician orders.   Expected Discharge Date:                  Expected Discharge Plan:  Home/Self Care  In-House Referral:     Discharge planning Services     Post Acute Care Choice:    Choice offered to:     DME Arranged:    DME Agency:     HH Arranged:    HH Agency:     Status of Service:  In process, will continue to follow  If discussed at Long Length of Stay Meetings, dates discussed:    Additional Comments:  Kermit BaloKelli F Zubayr Bednarczyk, RN 10/01/2016, 11:55 AM

## 2016-10-01 NOTE — Progress Notes (Signed)
STROKE TEAM PROGRESS NOTE   HISTORY OF PRESENT ILLNESS (per record) Adam Andrews is an 66 y.o. male with a history of CAD, and hypertension, presenting to [facility] after he experienced a transient episode of left facial left tongue left arm paresthesia along with left arm weakness.  The patient's symptoms had resolved by the time he was examined by the neurohospitalist.  Pt noted return of L arm paresthesia while raising arms for chest xray this morning, and also reports mild left facial numbness.  MI in Feb 2018 with subsequent cath/stent, put on ASA 81mg  and ticagrelor 90mg .  Pt not taking statin last 40 days due severe bilateral calf myalgia.  CT head negative.  MRA negative.  MRI with subcentimeter acute/early subacute infarction centered along right superolateral central sulcus in motor and sensory cortices.    Date last known well: Date: 09/29/2016 Time last known well: Time: 14:00  Modified Rankin: Rankin Score=0  Patient was not administered IV t-PA secondary to low NIHSS/presenting with minimal symptoms. He was admitted to General Neurology for further evaluation and treatment.   SUBJECTIVE (INTERVAL HISTORY) His wife, daughter, and granddaughter are at the bedside.  The patient is awake, alert, and follows all commands appropriately.  Had Carotid Doppler at Dr. Jens Som in July with 60-70% carotid stenosis.  Results are not in EHR.  Repeat carotid US 09/30/16 with 60-79% R ICA stenosis with significant heterogeneous plaque.  Vascular surgery consulted.  Dr. Imogene Burn ordered CT angiogram of the neck which shows greater than 70% proximal right ICA stenosis. Plan is to consider elective right carotid endarterectomy next week after cardiology clearance from Dr. Jens Som to stop Brilinta for 5 days    OBJECTIVE Temp:  [97.4 F (36.3 C)-98.6 F (37 C)] 97.4 F (36.3 C) (08/10 1002) Pulse Rate:  [73-80] 80 (08/10 1002) Cardiac Rhythm: Normal sinus rhythm (08/10 0701) Resp:   [15-20] 20 (08/10 1002) BP: (125-153)/(54-78) 153/76 (08/10 1002) SpO2:  [95 %-97 %] 96 % (08/10 1002)  CBC:   Recent Labs Lab 09/29/16 1626 09/29/16 1641 09/30/16 0431  WBC 7.0  --  6.1  NEUTROABS 4.6  --   --   HGB 13.7 13.9 13.3  HCT 39.4 41.0 38.9*  MCV 98.0  --  100.0  PLT 209  --  188    Basic Metabolic Panel:   Recent Labs Lab 09/29/16 1626 09/29/16 1641 09/30/16 0431  NA 139 141 141  K 3.9 3.9 3.5  CL 106 105 106  CO2 25  --  25  GLUCOSE 110* 108* 127*  BUN 8 10 8   CREATININE 0.90 0.80 0.85  CALCIUM 9.3  --  8.7*    Lipid Panel:     Component Value Date/Time   CHOL 169 09/30/2016 0431   TRIG 171 (H) 09/30/2016 0431   HDL 32 (L) 09/30/2016 0431   CHOLHDL 5.3 09/30/2016 0431   VLDL 34 09/30/2016 0431   LDLCALC 103 (H) 09/30/2016 0431   HgbA1c:  Lab Results  Component Value Date   HGBA1C 5.9 (H) 09/30/2016   Urine Drug Screen:     Component Value Date/Time   LABOPIA NONE DETECTED 09/29/2016 1954   COCAINSCRNUR NONE DETECTED 09/29/2016 1954   LABBENZ NONE DETECTED 09/29/2016 1954   AMPHETMU NONE DETECTED 09/29/2016 1954   THCU NONE DETECTED 09/29/2016 1954   LABBARB NONE DETECTED 09/29/2016 1954    Alcohol Level     Component Value Date/Time   ETH <5 09/29/2016 1626    IMAGING  Dg Chest 2 View 09/30/2016 IMPRESSION: No active cardiopulmonary disease.   CT Head Wo Contrast 09/30/2016 1. Negative CT of the head. 2. ASPECTS is 10/10  [facility] Brain Wo Contrast 09/29/2016 IMPRESSION: MRI of the head: 1. Subcentimeter acute/early subacute infarction centered along right superolateral central sulcus in motor and sensory cortices. No acute hemorrhage. 2. Small chronic cortical infarction in the right postcentral gyrus and within the left mid corona radiata. MRA of the head: Patent circle of Willis. No large vessel occlusion, aneurysm, or significant stenosis is identified.   TTE 09/30/2016 - No cardiac source of emboli was  indentified.  EF 55-60%  Carotid US 09/30/2016 The right internal carotid artery demonstrates a 60-79% stenosis with significant heterogeneous plaque.  The left internal carotid artery demonstrates a 1-39% stenosis. The right vertebral artery appears patent and antegrade, the left vertebral appears patent but with atypical , bidirectional flow.    PHYSICAL EXAM Pleasant middle aged Caucasian male not in distress. . Afebrile. Head is nontraumatic. Neck is supple without bruit.    Cardiac exam no murmur or gallop. Lungs are clear to auscultation. Distal pulses are well felt.  Neurological Exam ;  Awake  Alert oriented x 3. Normal speech and language.eye movements full without nystagmus.fundi were not visualized. Vision acuity and fields appear normal. Hearing is normal. Palatal movements are normal. Face symmetric. Tongue midline. Normal strength, tone, reflexes and coordination except diminished fine finger movements on left and weak left grip.Marland Kitchen.orbits right over left upper extremity Normal sensation. Gait deferred.  ASSESSMENT/PLAN Adam Andrews is a 66 y.o. male with history of CAD, hypertension,  presenting with transient left facial and tongue left arm paresthesia along with left arm weakness. He did not receive IV t-PA due to low NIHSS/presenting with minimal symptoms.  Stroke: Subcentimeter acute/early subacute right superolateral central sulcus infarction, lacunar, secondary to small vessel disease.    Resultant  mild left hand weakness   CT head: no acute stroke  MRI head: Subcentimeter acute/early subacute lacunar right superolateral central sulcus infarction  MRA head: No large vessel occlusion or stenosis.  Carotid Doppler: 60-79% R ICA stenosis with significant heterogeneous plaque.  The left internal carotid artery demonstrates a 1-39% stenosis. The right vertebral artery appears patent and antegrade, the left vertebral appears patent but with atypical , bidirectional  flow. CT angio neck : 1. 70% stenosis RIGHT internal carotid artery origin. 2. Occluded LEFT vertebral artery origin with thready  reconstitution. Pt will need right carotid endarterectomy- and is currently on asa and brillanta because he had a previous PCI of LAD with DES iN Feb 2018 . Cardiology was consulted and recommended that given the pt is 6 months out from the PCI procedure, he can be taken off the brillanta for the CEA. due to risk of bleeding. Pt needs to continue uninterrupted aspirin.    2D Echo: EF 55-60%. No source of embolus   LDL 103  HgbA1c 5.9  SCDs for VTE prophylaxis Diet Heart Room service appropriate? Yes; Fluid consistency: Thin  aspirin 81 mg daily and ticagrelor 90 mg daily prior to admission, now on aspirin 81 mg daily and ticagrelor 90 mg daily  Patient counseled to be compliant with his antithrombotic medications  Ongoing aggressive stroke risk factor management  Therapy recommendations:  none Disposition:  Home  Hypertension  Stable  Permissive hypertension (OK if < 220/120) but gradually normalize in 5-7 days  Long-term BP goal normotensive   Hyperlipidemia  Home meds:  none (not taking)  LDL 103, goal < 70  Add   Continue statin at discharge  h/o statin intolerance : Pt not taking statin last 40 days due severe bilateral calf myalgia.  Patient started taking simvastatin in 2010 and stopped Apr 15, 2016 due to severe bilateral calf myalgias.  Physician switched him to atorvastatin Apr 16, 2016.  He reports taking atorvastatin for about one month, and then stopped taking it, again due to severe bilateral calf myalgias effecting his ability to carry out ADLs.  After one month off atorvastatin, patient resumed taking it, and reports taking it again for one month before discontinuing it once more due to severe bilateral calf myalgias.    Other Stroke Risk Factors  Overweight, Body mass index is 28.93 kg/m., recommend weight loss, diet and  exercise as appropriate   Other Active Problems  None   Hospital day # 1  Signed Deneise Lever MD IMTS Stroke Team  I have personally examined this patient, reviewed notes, independently viewed imaging studies, participated in medical decision making and plan of care.ROS completed by me personally and pertinent positives fully documented  I have made any additions or clarifications directly to the above note. Agree with note above.  I had a long discussion with the patient, wife, daughter and Dr. Waymon Amato and Dr. Jens Som and agree with plan for elective right carotid endarterectomy next week. Continue aspirin and hold Brilinta for 5 days prior to surgery. Greater than 50% time during this 25 minute visit was spent on counseling and coordination of care about his carotid stenosis, discussion about revascularization options, antiplatelet therapy and bleeding risk and answering questions Follow-up as an outpatient in stroke clinic in 6 weeks. Stroke team will sign off. Kindly call for questions.  Delia Heady, MD Medical Director [facility] Pager: [phone] 10/01/2016 12:26 PM   To contact Stroke Continuity provider, please refer to [email or web address]. After hours, contact General Neurology

## 2016-10-04 ENCOUNTER — Encounter: Payer: Self-pay | Admitting: Cardiology

## 2016-10-04 ENCOUNTER — Other Ambulatory Visit: Payer: Self-pay

## 2016-10-04 ENCOUNTER — Encounter (HOSPITAL_COMMUNITY): Payer: Self-pay

## 2016-10-04 ENCOUNTER — Encounter (HOSPITAL_COMMUNITY)
Admission: RE | Admit: 2016-10-04 | Discharge: 2016-10-04 | Disposition: A | Discharge status: Home / Self Care | Payer: BLUE CROSS/BLUE SHIELD | Source: Ambulatory Visit | Attending: Vascular Surgery | Admitting: Vascular Surgery

## 2016-10-04 DIAGNOSIS — Z87891 Personal history of nicotine dependence: Secondary | ICD-10-CM | POA: Insufficient documentation

## 2016-10-04 DIAGNOSIS — Z79899 Other long term (current) drug therapy: Secondary | ICD-10-CM

## 2016-10-04 DIAGNOSIS — Z01812 Encounter for preprocedural laboratory examination: Secondary | ICD-10-CM

## 2016-10-04 DIAGNOSIS — E785 Hyperlipidemia, unspecified: Secondary | ICD-10-CM

## 2016-10-04 DIAGNOSIS — Z8673 Personal history of transient ischemic attack (TIA), and cerebral infarction without residual deficits: Secondary | ICD-10-CM

## 2016-10-04 DIAGNOSIS — Z955 Presence of coronary angioplasty implant and graft: Secondary | ICD-10-CM | POA: Insufficient documentation

## 2016-10-04 DIAGNOSIS — Z0183 Encounter for blood typing: Secondary | ICD-10-CM | POA: Insufficient documentation

## 2016-10-04 DIAGNOSIS — I255 Ischemic cardiomyopathy: Secondary | ICD-10-CM

## 2016-10-04 DIAGNOSIS — Z01818 Encounter for other preprocedural examination: Secondary | ICD-10-CM

## 2016-10-04 DIAGNOSIS — Z7902 Long term (current) use of antithrombotics/antiplatelets: Secondary | ICD-10-CM | POA: Insufficient documentation

## 2016-10-04 DIAGNOSIS — Z7982 Long term (current) use of aspirin: Secondary | ICD-10-CM

## 2016-10-04 DIAGNOSIS — I2510 Atherosclerotic heart disease of native coronary artery without angina pectoris: Secondary | ICD-10-CM | POA: Insufficient documentation

## 2016-10-04 DIAGNOSIS — I10 Essential (primary) hypertension: Secondary | ICD-10-CM

## 2016-10-04 HISTORY — DX: Paresthesia of skin: R20.2

## 2016-10-04 HISTORY — DX: Unspecified cataract: H26.9

## 2016-10-04 HISTORY — DX: Benign prostatic hyperplasia without lower urinary tract symptoms: N40.0

## 2016-10-04 HISTORY — DX: Cerebral infarction, unspecified: I63.9

## 2016-10-04 HISTORY — DX: Occlusion and stenosis of right carotid artery: I65.21

## 2016-10-04 HISTORY — DX: Other chronic pain: G89.29

## 2016-10-04 HISTORY — DX: Acute myocardial infarction, unspecified: I21.9

## 2016-10-04 HISTORY — DX: Anesthesia of skin: R20.0

## 2016-10-04 LAB — COMPREHENSIVE METABOLIC PANEL
ALBUMIN: 4.4 g/dL (ref 3.5–5.0)
ALK PHOS: 58 U/L (ref 38–126)
ALT: 27 U/L (ref 17–63)
AST: 26 U/L (ref 15–41)
Anion gap: 8 (ref 5–15)
BUN: 11 mg/dL (ref 6–20)
CALCIUM: 9.3 mg/dL (ref 8.9–10.3)
CO2: 26 mmol/L (ref 22–32)
CREATININE: 1 mg/dL (ref 0.61–1.24)
Chloride: 103 mmol/L (ref 101–111)
GFR calc Af Amer: >60 mL/min (ref >60)
GFR calc non Af Amer: >60 mL/min (ref >60)
GLUCOSE: 106 mg/dL — AB (ref 65–99)
Potassium: 3.8 mmol/L (ref 3.5–5.1)
Sodium: 137 mmol/L (ref 135–145)
Total Bilirubin: 0.7 mg/dL (ref 0.3–1.2)
Total Protein: 7.4 g/dL (ref 6.5–8.1)

## 2016-10-04 LAB — CBC
HEMATOCRIT: 39.7 % (ref 39.0–52.0)
HEMOGLOBIN: 14 g/dL (ref 13.0–17.0)
MCH: 34.7 pg — AB (ref 26.0–34.0)
MCHC: 35.3 g/dL (ref 30.0–36.0)
MCV: 98.3 fL (ref 78.0–100.0)
Platelets: 227 10*3/uL (ref 150–400)
RBC: 4.04 MIL/uL — AB (ref 4.22–5.81)
RDW: 13.5 % (ref 11.5–15.5)
WBC: 8 10*3/uL (ref 4.0–10.5)

## 2016-10-04 LAB — TYPE AND SCREEN
ABO/RH(D): B POS
Antibody Screen: NEGATIVE

## 2016-10-04 LAB — ABO/RH: ABO/RH(D): B POS

## 2016-10-04 LAB — SURGICAL PCR SCREEN
MRSA, PCR: NEGATIVE
STAPHYLOCOCCUS AUREUS: NEGATIVE

## 2016-10-04 NOTE — Progress Notes (Signed)
Pt denies any acute cardiopulmonary issues. Pt under the care of Dr. Jens Somrenshaw, Cardiology. Pt stated that his last dose of Brilinta was probably Friday, prior to hospital discharge. Pt stated that a stress test was performed in 2010. Anesthesia asked to review pt history ( see note).

## 2016-10-04 NOTE — Anesthesia Preprocedure Evaluation (Addendum)
Anesthesia Evaluation  Patient identified by MRN, date of birth, ID band Patient awake    Reviewed: Allergy & Precautions, NPO status , Patient's Chart, lab work & pertinent test results  Airway Mallampati: I  TM Distance: >3 FB Neck ROM: Full    Dental no notable dental hx. (+) Dental Advisory Given   Pulmonary former smoker,    Pulmonary exam normal breath sounds clear to auscultation       Cardiovascular hypertension, + CAD, + Past MI and + Peripheral Vascular Disease  Normal cardiovascular exam Rhythm:Regular Rate:Normal  TTE 09/30/16:  - Left ventricle: The cavity size was normal. There was mild concentric hypertrophy. Systolic function was normal. The estimated ejection fraction was in the range of 55% to 60%. Wall motion was normal; there were no regional wall motion abnormalities. Doppler parameters are consistent with abnormal left ventricular relaxation (grade 1 diastolic dysfunction). Doppler parameters are consistent with elevated ventricular end-diastolic filling pressure. - Aortic valve: There was no regurgitation. - Aortic root: The aortic root was normal in size. - Mitral valve: There was no regurgitation. - Left atrium: The atrium was mildly dilated. - Right ventricle: Systolic function was normal. - Tricuspid valve: There was no regurgitation. - Pulmonary arteries: Systolic pressure was within the normal range. - Inferior vena cava: The vessel was normal in size. - Pericardium, extracardiac: There was no pericardial effusion.  Carotid Duplex 09/30/16 - 60-79% right ICAS, < 39% left ICAS  Cath 04/15/16 -  Patent stent in the RCA. Prox LAD lesion, 99 %stenosed, thrombotic, culprit lesion. A STENT SYNERGY DES 3X20 drug eluting stent was successfully placed. and post dilated to 3.75 mm proximally. Post intervention, there is a 0% residual stenosis. LV end diastolic pressure is moderately elevated.    Neuro/Psych TIAnegative psych ROS   GI/Hepatic negative GI ROS, Neg liver ROS,   Endo/Other  negative endocrine ROS  Renal/GU negative Renal ROS  negative genitourinary   Musculoskeletal negative musculoskeletal ROS (+)   Abdominal   Peds  Hematology negative hematology ROS (+)   Anesthesia Other Findings   Reproductive/Obstetrics                            Anesthesia Physical Anesthesia Plan  ASA: III  Anesthesia Plan: General   Post-op Pain Management:    Induction: Intravenous  PONV Risk Score and Plan: 2 and Ondansetron, Dexamethasone and Treatment may vary due to age or medical condition  Airway Management Planned: Oral ETT  Additional Equipment: Arterial line  Intra-op Plan:   Post-operative Plan: Extubation in OR  Informed Consent: I have reviewed the patients History and Physical, chart, labs and discussed the procedure including the risks, benefits and alternatives for the proposed anesthesia with the patient or authorized representative who has indicated his/her understanding and acceptance.   Dental advisory given  Plan Discussed with: CRNA  Anesthesia Plan Comments:         Anesthesia Quick Evaluation

## 2016-10-04 NOTE — Pre-Procedure Instructions (Signed)
    Maurene CapesLeonard L Naumann  10/04/2016      [facility] [address] - [place], [place] - [id number] [place] [address] Phone: [phone] Fax: [phone]    Your procedure is scheduled on Tuesday, October 05, 2016  [facility] at 5:30 A.M.  Call this number if you have problems the morning of surgery:  [phone]   Remember:  Do not eat food or drink liquids after midnight.  Take these medicines the morning of surgery with A SIP OF WATER :carvedilol (COREG), if needed: oxymetazoline (AFRIN)  nasal spray for sinus pressure,  tamsulosin (FLOMAX) for prostate pain, nitroGLYCERIN (NITROSTAT) for chest pain Stop taking vitamins, fish oil and herbal medications. Do not take any NSAIDs ie: Ibuprofen, Advil, Naproxen (Aleve), Motrin, BC and Goody Powder; stop now  Do not wear jewelry, make-up or nail polish.  Do not wear lotions, powders, or perfumes, or deoderant.  Do not shave 48 hours prior to surgery.  Men may shave face and neck.  Do not bring valuables to the hospital.  [facility] is not responsible for any belongings or valuables.  Contacts, dentures or bridgework may not be worn into surgery.  Leave your suitcase in the car.  After surgery it may be brought to your room. For patients admitted to the hospital, discharge time will be determined by your treatment team. Patients discharged the day of surgery will not be allowed to drive home.  Special instructions: Shower the night before surgery and the morning of surgery with CHG. Please read over the following fact sheets that you were given. Pain Booklet, Coughing and Deep Breathing, Blood Transfusion Information, MRSA Information and Surgical Site Infection Prevention

## 2016-10-04 NOTE — Progress Notes (Signed)
Anesthesia Chart Review:  Pt is a 66 year old male scheduled for R CEA on 10/05/2016 with Adam SakeBrian Chen, MD  - PCP is Adam RealYun Sun, MD - Cardiologist is Adam MillersBrian Crenshaw, MD who evaluated pt 10/01/16 while inpatient for stroke for clearance for CEA.  Cleared for surgery with ok to hold brilinta 5 days before surgery.   PMH includes:  CVA (09/29/16), CAD (DES to LAD 04/15/16; stent to RCA 2010), HTN, ischemic cardiomyopathy, hyperlipidemia, pulmonary nodule (by 10/01/16 CT neck; needs f/u in 1 year). Former smoker. BMI 29  - Hospitalized 8/8-10/18 for CVA.   Medications include: ASA 81mg , carvedilol, lisinopril, brilinta.  Pt to hold brilinta 5 days before surgery. Pt to continue ASA perioperatively.   BP (!) 145/62   Pulse 83   Temp 36.6 C   Resp 20   Ht 5\' 10"  (1.778 m)   Wt 202 lb (91.6 kg)   SpO2 99%   BMI 28.98 kg/m   Labs from hospitalization 09/30/16 reviewed.  CMET and CBC are acceptable for surgery.   CXR 09/30/16: No active cardiopulmonary disease.  EKG 09/30/16: NSR  CT angio neck 09/30/16:  1. 70% stenosis RIGHT internal carotid artery origin. 2. Occluded LEFT vertebral artery origin with thready reconstitution. 3. An incidental finding of potential clinical significance has been found. 4 mm LEFT upper lobe pulmonary nodule. Consider follow-up CT at 12 months. 4. Moderate canal stenosis C4-5 and C5-6. Severe C5-6 and C6-7 neural foraminal narrowing.  Carotid duplex 09/30/16:  - Findings consistent with 60 - 79 percent stenosis involving the right internal carotid artery. - Findings consistent with less than 39 percent stenosis involving the left internal carotid artery. - The right vertebral artery appears patent and antegrade, the left vertebral appears patent but with atypical, bidirectional flow.  Echo 09/30/16:  - Left ventricle: The cavity size was normal. There was mild concentric hypertrophy. Systolic function was normal. The estimated ejection fraction was in the range of 55% to  60%. Wall motion was normal; there were no regional wall motion abnormalities. Doppler parameters are consistent with abnormal left ventricular relaxation (grade 1 diastolic dysfunction). Doppler parameters are consistent with elevated ventricular end-diastolic filling pressure. - Aortic valve: There was no regurgitation. - Aortic root: The aortic root was normal in size. - Mitral valve: There was no regurgitation. - Left atrium: The atrium was mildly dilated. - Right ventricle: Systolic function was normal. - Tricuspid valve: There was no regurgitation. - Pulmonary arteries: Systolic pressure was within the normal range. - Inferior vena cava: The vessel was normal in size. - Pericardium, extracardiac: There was no pericardial effusion. - Impressions: No cardiac source of emboli was indentified.  Cardiac cath 04/15/16:   Patent stent in the RCA.  Prox LAD lesion, 99 %stenosed, thrombotic, culprit lesion. A STENT SYNERGY DES 3X20 drug eluting stent was successfully placed. and post dilated to 3.75 mm proximally.  Post intervention, there is a 0% residual stenosis.  LV end diastolic pressure is moderately elevated.  If no changes, I anticipate pt can proceed with surgery as scheduled.   Adam Mastngela Jasime Westergren, FNP-BC [facility]Anesthesiology Phone: [phone] 10/04/2016 2:52 PM

## 2016-10-04 NOTE — Progress Notes (Signed)
PCR not completed; please follow up on DOS and treat if necessary.

## 2016-10-05 ENCOUNTER — Inpatient Hospital Stay (HOSPITAL_COMMUNITY): Payer: BLUE CROSS/BLUE SHIELD | Admitting: Certified Registered Nurse Anesthetist

## 2016-10-05 ENCOUNTER — Encounter (HOSPITAL_COMMUNITY): Payer: Self-pay | Admitting: *Deleted

## 2016-10-05 ENCOUNTER — Inpatient Hospital Stay (HOSPITAL_COMMUNITY): Payer: BLUE CROSS/BLUE SHIELD | Admitting: Emergency Medicine

## 2016-10-05 ENCOUNTER — Inpatient Hospital Stay (HOSPITAL_COMMUNITY)
Admission: RE | Admit: 2016-10-05 | Discharge: 2016-10-06 | DRG: 039 | Disposition: A | Discharge status: Home / Self Care | Payer: BLUE CROSS/BLUE SHIELD | Source: Ambulatory Visit | Attending: Vascular Surgery | Admitting: Vascular Surgery

## 2016-10-05 ENCOUNTER — Encounter (HOSPITAL_COMMUNITY)
Admission: RE | Disposition: A | Discharge status: Home / Self Care | Payer: Self-pay | Source: Ambulatory Visit | Attending: Vascular Surgery

## 2016-10-05 DIAGNOSIS — Z8249 Family history of ischemic heart disease and other diseases of the circulatory system: Secondary | ICD-10-CM | POA: Diagnosis not present

## 2016-10-05 DIAGNOSIS — Z8673 Personal history of transient ischemic attack (TIA), and cerebral infarction without residual deficits: Secondary | ICD-10-CM

## 2016-10-05 DIAGNOSIS — Z79899 Other long term (current) drug therapy: Secondary | ICD-10-CM

## 2016-10-05 DIAGNOSIS — I2510 Atherosclerotic heart disease of native coronary artery without angina pectoris: Secondary | ICD-10-CM | POA: Diagnosis present

## 2016-10-05 DIAGNOSIS — I10 Essential (primary) hypertension: Secondary | ICD-10-CM | POA: Diagnosis present

## 2016-10-05 DIAGNOSIS — I6523 Occlusion and stenosis of bilateral carotid arteries: Secondary | ICD-10-CM | POA: Diagnosis present

## 2016-10-05 DIAGNOSIS — I252 Old myocardial infarction: Secondary | ICD-10-CM

## 2016-10-05 DIAGNOSIS — Z7982 Long term (current) use of aspirin: Secondary | ICD-10-CM | POA: Diagnosis not present

## 2016-10-05 DIAGNOSIS — Z9861 Coronary angioplasty status: Secondary | ICD-10-CM

## 2016-10-05 DIAGNOSIS — E785 Hyperlipidemia, unspecified: Secondary | ICD-10-CM | POA: Diagnosis present

## 2016-10-05 DIAGNOSIS — I6521 Occlusion and stenosis of right carotid artery: Principal | ICD-10-CM | POA: Diagnosis present

## 2016-10-05 DIAGNOSIS — Z87891 Personal history of nicotine dependence: Secondary | ICD-10-CM | POA: Diagnosis not present

## 2016-10-05 DIAGNOSIS — Z823 Family history of stroke: Secondary | ICD-10-CM | POA: Diagnosis not present

## 2016-10-05 DIAGNOSIS — I6529 Occlusion and stenosis of unspecified carotid artery: Secondary | ICD-10-CM | POA: Diagnosis present

## 2016-10-05 HISTORY — PX: ENDARTERECTOMY: SHX5162

## 2016-10-05 HISTORY — PX: PATCH ANGIOPLASTY: SHX6230

## 2016-10-05 LAB — CBC
HEMATOCRIT: 32.7 % — AB (ref 39.0–52.0)
HEMOGLOBIN: 11.2 g/dL — AB (ref 13.0–17.0)
MCH: 33.7 pg (ref 26.0–34.0)
MCHC: 34.3 g/dL (ref 30.0–36.0)
MCV: 98.5 fL (ref 78.0–100.0)
PLATELETS: 167 10*3/uL (ref 150–400)
RBC: 3.32 MIL/uL — AB (ref 4.22–5.81)
RDW: 13.5 % (ref 11.5–15.5)
WBC: 5.1 10*3/uL (ref 4.0–10.5)

## 2016-10-05 LAB — CREATININE, SERUM
CREATININE: 0.94 mg/dL (ref 0.61–1.24)
GFR calc Af Amer: >60 mL/min (ref >60)

## 2016-10-05 SURGERY — ENDARTERECTOMY, CAROTID
Anesthesia: General | Site: Neck | Laterality: Right

## 2016-10-05 MED ORDER — GUAIFENESIN-DM 100-10 MG/5ML PO SYRP: 15.0000 mL | ORAL_SOLUTION | ORAL | Status: DC | PRN

## 2016-10-05 MED ORDER — HEPARIN SODIUM (PORCINE) 1000 UNIT/ML IJ SOLN
INTRAMUSCULAR | Status: DC | PRN
  Administered 2016-10-05: 9500 [IU] via INTRAVENOUS
  Administered 2016-10-05: 2000 [IU] via INTRAVENOUS

## 2016-10-05 MED ORDER — HEPARIN SODIUM (PORCINE) 5000 UNIT/ML IJ SOLN
INTRAMUSCULAR | Status: DC | PRN
  Administered 2016-10-05: 08:00:00

## 2016-10-05 MED ORDER — POTASSIUM CHLORIDE CRYS ER 20 MEQ PO TBCR: 20.0000 meq | EXTENDED_RELEASE_TABLET | Freq: Every day | ORAL | Status: DC | PRN

## 2016-10-05 MED ORDER — ASPIRIN 81 MG PO CHEW: 81.0000 mg | CHEWABLE_TABLET | Freq: Every day | ORAL | Status: DC

## 2016-10-05 MED ORDER — ALUM & MAG HYDROXIDE-SIMETH 200-200-20 MG/5ML PO SUSP: 15.0000 mL | ORAL | Status: DC | PRN

## 2016-10-05 MED ORDER — PROMETHAZINE HCL 25 MG/ML IJ SOLN: 6.2500 mg | INTRAMUSCULAR | Status: DC | PRN

## 2016-10-05 MED ORDER — EPHEDRINE 5 MG/ML INJ
INTRAVENOUS | Status: AC
  Filled 2016-10-05: qty 10

## 2016-10-05 MED ORDER — METOPROLOL TARTRATE 5 MG/5ML IV SOLN: 2.0000 mg | INTRAVENOUS | Status: DC | PRN

## 2016-10-05 MED ORDER — TAMSULOSIN HCL 0.4 MG PO CAPS
0.4000 mg | ORAL_CAPSULE | Freq: Every day | ORAL | Status: DC
  Administered 2016-10-06: 0.4 mg via ORAL
  Filled 2016-10-05: qty 1

## 2016-10-05 MED ORDER — OXYCODONE-ACETAMINOPHEN 5-325 MG PO TABS: 1.0000 | ORAL_TABLET | ORAL | Status: DC | PRN

## 2016-10-05 MED ORDER — LISINOPRIL 10 MG PO TABS
10.0000 mg | ORAL_TABLET | Freq: Every day | ORAL | Status: DC
  Administered 2016-10-06: 10 mg via ORAL
  Filled 2016-10-05: qty 1

## 2016-10-05 MED ORDER — ONDANSETRON HCL 4 MG/2ML IJ SOLN
INTRAMUSCULAR | Status: DC | PRN
  Administered 2016-10-05: 4 mg via INTRAVENOUS

## 2016-10-05 MED ORDER — ASPIRIN EC 325 MG PO TBEC
325.0000 mg | DELAYED_RELEASE_TABLET | Freq: Every day | ORAL | Status: DC
  Administered 2016-10-05: 325 mg via ORAL
  Filled 2016-10-05: qty 1

## 2016-10-05 MED ORDER — SUGAMMADEX SODIUM 200 MG/2ML IV SOLN
INTRAVENOUS | Status: AC
  Filled 2016-10-05: qty 2

## 2016-10-05 MED ORDER — PHENYLEPHRINE 40 MCG/ML (10ML) SYRINGE FOR IV PUSH (FOR BLOOD PRESSURE SUPPORT)
PREFILLED_SYRINGE | INTRAVENOUS | Status: DC | PRN
  Administered 2016-10-05: 80 ug via INTRAVENOUS
  Administered 2016-10-05: 40 ug via INTRAVENOUS
  Administered 2016-10-05: 80 ug via INTRAVENOUS

## 2016-10-05 MED ORDER — ACETAMINOPHEN 325 MG PO TABS
325.0000 mg | ORAL_TABLET | ORAL | Status: DC | PRN
  Administered 2016-10-05 – 2016-10-06 (×3): 650 mg via ORAL
  Filled 2016-10-05 (×3): qty 2

## 2016-10-05 MED ORDER — HEMOSTATIC AGENTS (NO CHARGE) OPTIME
TOPICAL | Status: DC | PRN
  Administered 2016-10-05: 1 via TOPICAL

## 2016-10-05 MED ORDER — SODIUM CHLORIDE 0.9 % IV SOLN
0.0125 ug/kg/min | INTRAVENOUS | Status: AC
  Administered 2016-10-05: .1 ug/kg/min via INTRAVENOUS
  Filled 2016-10-05: qty 2000

## 2016-10-05 MED ORDER — CARVEDILOL 12.5 MG PO TABS
12.5000 mg | ORAL_TABLET | Freq: Two times a day (BID) | ORAL | Status: DC
  Administered 2016-10-06: 12.5 mg via ORAL
  Filled 2016-10-05: qty 1

## 2016-10-05 MED ORDER — DEXTROSE 5 % IV SOLN
1.5000 g | INTRAVENOUS | Status: AC
  Administered 2016-10-05: 1.5 g via INTRAVENOUS
  Filled 2016-10-05: qty 1.5

## 2016-10-05 MED ORDER — LIDOCAINE HCL (PF) 1 % IJ SOLN
INTRAMUSCULAR | Status: AC
  Filled 2016-10-05: qty 30

## 2016-10-05 MED ORDER — PANTOPRAZOLE SODIUM 40 MG PO TBEC
40.0000 mg | DELAYED_RELEASE_TABLET | Freq: Every day | ORAL | Status: DC
  Administered 2016-10-06: 40 mg via ORAL
  Filled 2016-10-05: qty 1

## 2016-10-05 MED ORDER — CHLORHEXIDINE GLUCONATE CLOTH 2 % EX PADS: 6.0000 | MEDICATED_PAD | Freq: Once | CUTANEOUS | Status: DC

## 2016-10-05 MED ORDER — SUGAMMADEX SODIUM 200 MG/2ML IV SOLN
INTRAVENOUS | Status: DC | PRN
  Administered 2016-10-05: 200 mg via INTRAVENOUS

## 2016-10-05 MED ORDER — ADULT MULTIVITAMIN W/MINERALS CH
1.0000 | ORAL_TABLET | Freq: Every day | ORAL | Status: DC
  Administered 2016-10-06: 1 via ORAL
  Filled 2016-10-05: qty 1

## 2016-10-05 MED ORDER — ACETAMINOPHEN 650 MG RE SUPP: 325.0000 mg | RECTAL | Status: DC | PRN

## 2016-10-05 MED ORDER — LACTATED RINGERS IV SOLN
INTRAVENOUS | Status: DC | PRN
  Administered 2016-10-05: 07:00:00 via INTRAVENOUS

## 2016-10-05 MED ORDER — LIDOCAINE 2% (20 MG/ML) 5 ML SYRINGE
INTRAMUSCULAR | Status: DC | PRN
  Administered 2016-10-05: 40 mg via INTRAVENOUS

## 2016-10-05 MED ORDER — DEXTRAN 40 IN SALINE 10-0.9 % IV SOLN
INTRAVENOUS | Status: AC | PRN
Start: 2016-10-05 — End: 2016-10-05
  Administered 2016-10-05: 500 mL

## 2016-10-05 MED ORDER — FENTANYL CITRATE (PF) 100 MCG/2ML IJ SOLN
25.0000 ug | INTRAMUSCULAR | Status: DC | PRN
  Administered 2016-10-05: 25 ug via INTRAVENOUS
  Administered 2016-10-05 (×2): 50 ug via INTRAVENOUS
  Administered 2016-10-05: 25 ug via INTRAVENOUS

## 2016-10-05 MED ORDER — HYDRALAZINE HCL 20 MG/ML IJ SOLN: 5.0000 mg | INTRAMUSCULAR | Status: DC | PRN

## 2016-10-05 MED ORDER — NITROGLYCERIN 0.4 MG SL SUBL: 0.4000 mg | SUBLINGUAL_TABLET | SUBLINGUAL | Status: DC | PRN

## 2016-10-05 MED ORDER — LIDOCAINE HCL (PF) 1 % IJ SOLN
INTRAMUSCULAR | Status: DC | PRN
  Administered 2016-10-05: 30 mL

## 2016-10-05 MED ORDER — 0.9 % SODIUM CHLORIDE (POUR BTL) OPTIME
TOPICAL | Status: DC | PRN
  Administered 2016-10-05: 2000 mL

## 2016-10-05 MED ORDER — LACTATED RINGERS IV SOLN
INTRAVENOUS | Status: DC | PRN
  Administered 2016-10-05 (×2): via INTRAVENOUS

## 2016-10-05 MED ORDER — FENTANYL CITRATE (PF) 250 MCG/5ML IJ SOLN
INTRAMUSCULAR | Status: AC
  Filled 2016-10-05: qty 5

## 2016-10-05 MED ORDER — DEXTRAN 40 IN SALINE 10-0.9 % IV SOLN
INTRAVENOUS | Status: AC
  Filled 2016-10-05: qty 500

## 2016-10-05 MED ORDER — MORPHINE SULFATE (PF) 2 MG/ML IV SOLN
2.0000 mg | INTRAVENOUS | Status: DC | PRN
  Administered 2016-10-05: 2 mg via INTRAVENOUS
  Filled 2016-10-05: qty 1

## 2016-10-05 MED ORDER — BISACODYL 5 MG PO TBEC: 5.0000 mg | DELAYED_RELEASE_TABLET | Freq: Every day | ORAL | Status: DC | PRN

## 2016-10-05 MED ORDER — FENTANYL CITRATE (PF) 100 MCG/2ML IJ SOLN
INTRAMUSCULAR | Status: AC
  Filled 2016-10-05: qty 2

## 2016-10-05 MED ORDER — MAGNESIUM SULFATE 2 GM/50ML IV SOLN
2.0000 g | Freq: Every day | INTRAVENOUS | Status: DC | PRN
  Filled 2016-10-05: qty 50

## 2016-10-05 MED ORDER — PROPOFOL 10 MG/ML IV BOLUS
INTRAVENOUS | Status: DC | PRN
  Administered 2016-10-05: 120 mg via INTRAVENOUS

## 2016-10-05 MED ORDER — PROPOFOL 10 MG/ML IV BOLUS
INTRAVENOUS | Status: AC
  Filled 2016-10-05: qty 40

## 2016-10-05 MED ORDER — LIDOCAINE 2% (20 MG/ML) 5 ML SYRINGE
INTRAMUSCULAR | Status: AC
  Filled 2016-10-05: qty 10

## 2016-10-05 MED ORDER — DEXTROSE 5 % IV SOLN
1.5000 g | Freq: Two times a day (BID) | INTRAVENOUS | Status: AC
  Administered 2016-10-05 – 2016-10-06 (×2): 1.5 g via INTRAVENOUS
  Filled 2016-10-05 (×2): qty 1.5

## 2016-10-05 MED ORDER — PROTAMINE SULFATE 10 MG/ML IV SOLN
INTRAVENOUS | Status: DC | PRN
  Administered 2016-10-05: 50 mg via INTRAVENOUS

## 2016-10-05 MED ORDER — DEXAMETHASONE SODIUM PHOSPHATE 10 MG/ML IJ SOLN
INTRAMUSCULAR | Status: AC
  Filled 2016-10-05: qty 1

## 2016-10-05 MED ORDER — DEXAMETHASONE SODIUM PHOSPHATE 10 MG/ML IJ SOLN
INTRAMUSCULAR | Status: DC | PRN
  Administered 2016-10-05: 5 mg via INTRAVENOUS

## 2016-10-05 MED ORDER — MIDAZOLAM HCL 2 MG/2ML IJ SOLN
INTRAMUSCULAR | Status: AC
Start: 2016-10-05 — End: 2016-10-05
  Filled 2016-10-05: qty 2

## 2016-10-05 MED ORDER — LABETALOL HCL 5 MG/ML IV SOLN: 10.0000 mg | INTRAVENOUS | Status: DC | PRN

## 2016-10-05 MED ORDER — ENOXAPARIN SODIUM 40 MG/0.4ML ~~LOC~~ SOLN: 40.0000 mg | SUBCUTANEOUS | Status: DC

## 2016-10-05 MED ORDER — SODIUM CHLORIDE 0.9 % IV SOLN: INTRAVENOUS | Status: DC

## 2016-10-05 MED ORDER — GLYCOPYRROLATE 0.2 MG/ML IJ SOLN
INTRAMUSCULAR | Status: DC | PRN
  Administered 2016-10-05: 0.2 mg via INTRAVENOUS

## 2016-10-05 MED ORDER — DOCUSATE SODIUM 100 MG PO CAPS
100.0000 mg | ORAL_CAPSULE | Freq: Every day | ORAL | Status: DC
  Administered 2016-10-06: 100 mg via ORAL
  Filled 2016-10-05: qty 1

## 2016-10-05 MED ORDER — SODIUM CHLORIDE 0.9 % IV SOLN
500.0000 mL | Freq: Once | INTRAVENOUS | Status: AC | PRN
  Administered 2016-10-05: 500 mL via INTRAVENOUS

## 2016-10-05 MED ORDER — ROCURONIUM BROMIDE 10 MG/ML (PF) SYRINGE
PREFILLED_SYRINGE | INTRAVENOUS | Status: DC | PRN
  Administered 2016-10-05 (×2): 20 mg via INTRAVENOUS
  Administered 2016-10-05: 10 mg via INTRAVENOUS
  Administered 2016-10-05: 50 mg via INTRAVENOUS

## 2016-10-05 MED ORDER — ONDANSETRON HCL 4 MG/2ML IJ SOLN
INTRAMUSCULAR | Status: AC
  Filled 2016-10-05: qty 2

## 2016-10-05 MED ORDER — PHENYLEPHRINE 40 MCG/ML (10ML) SYRINGE FOR IV PUSH (FOR BLOOD PRESSURE SUPPORT)
PREFILLED_SYRINGE | INTRAVENOUS | Status: AC
  Filled 2016-10-05: qty 10

## 2016-10-05 MED ORDER — SENNOSIDES-DOCUSATE SODIUM 8.6-50 MG PO TABS: 1.0000 | ORAL_TABLET | Freq: Every evening | ORAL | Status: DC | PRN

## 2016-10-05 MED ORDER — OXYMETAZOLINE HCL 0.05 % NA SOLN
1.0000 | Freq: Two times a day (BID) | NASAL | Status: DC | PRN
Start: 2016-10-05 — End: 2016-10-06
  Filled 2016-10-05: qty 15

## 2016-10-05 MED ORDER — PHENOL 1.4 % MT LIQD
1.0000 | OROMUCOSAL | Status: DC | PRN
Start: 2016-10-05 — End: 2016-10-06

## 2016-10-05 MED ORDER — ONDANSETRON HCL 4 MG/2ML IJ SOLN: 4.0000 mg | Freq: Four times a day (QID) | INTRAMUSCULAR | Status: DC | PRN

## 2016-10-05 MED ORDER — SODIUM CHLORIDE 0.9 % IV SOLN
INTRAVENOUS | Status: DC
  Administered 2016-10-05: 21:00:00 via INTRAVENOUS

## 2016-10-05 MED ORDER — ROCURONIUM BROMIDE 10 MG/ML (PF) SYRINGE
PREFILLED_SYRINGE | INTRAVENOUS | Status: AC
  Filled 2016-10-05: qty 5

## 2016-10-05 MED ORDER — EPHEDRINE SULFATE-NACL 50-0.9 MG/10ML-% IV SOSY
PREFILLED_SYRINGE | INTRAVENOUS | Status: DC | PRN
  Administered 2016-10-05: 10 mg via INTRAVENOUS
  Administered 2016-10-05: 5 mg via INTRAVENOUS
  Administered 2016-10-05 (×3): 10 mg via INTRAVENOUS

## 2016-10-05 MED ORDER — PHENYLEPHRINE HCL 10 MG/ML IJ SOLN
INTRAVENOUS | Status: DC | PRN
  Administered 2016-10-05: 10:00:00 via INTRAVENOUS
  Administered 2016-10-05: 25 ug/min via INTRAVENOUS

## 2016-10-05 MED ORDER — PROTAMINE SULFATE 10 MG/ML IV SOLN
INTRAVENOUS | Status: AC
  Filled 2016-10-05: qty 5

## 2016-10-05 MED ORDER — FENTANYL CITRATE (PF) 100 MCG/2ML IJ SOLN
INTRAMUSCULAR | Status: DC | PRN
  Administered 2016-10-05 (×2): 50 ug via INTRAVENOUS
  Administered 2016-10-05: 25 ug via INTRAVENOUS

## 2016-10-05 SURGICAL SUPPLY — 48 items
ADH SKN CLS APL DERMABOND .7 (GAUZE/BANDAGES/DRESSINGS) ×1
AGENT HMST SPONGE THK3/8 (HEMOSTASIS) ×1
BAG DECANTER FOR FLEXI CONT (MISCELLANEOUS) ×2 IMPLANT
CANISTER SUCT 3000ML PPV (MISCELLANEOUS) ×2 IMPLANT
CATH ROBINSON RED A/P 18FR (CATHETERS) ×2 IMPLANT
CLIP VESOCCLUDE MED 24/CT (CLIP) ×2 IMPLANT
CLIP VESOCCLUDE SM WIDE 24/CT (CLIP) ×2 IMPLANT
COVER PROBE W GEL 5X96 (DRAPES) ×1 IMPLANT
CRADLE DONUT ADULT HEAD (MISCELLANEOUS) ×2 IMPLANT
DERMABOND ADVANCED (GAUZE/BANDAGES/DRESSINGS) ×1
DERMABOND ADVANCED .7 DNX12 (GAUZE/BANDAGES/DRESSINGS) ×1 IMPLANT
ELECT REM PT RETURN 9FT ADLT (ELECTROSURGICAL) ×2
ELECTRODE REM PT RTRN 9FT ADLT (ELECTROSURGICAL) ×1 IMPLANT
GLOVE BIO SURGEON STRL SZ7 (GLOVE) ×2 IMPLANT
GLOVE BIOGEL PI IND STRL 6.5 (GLOVE) IMPLANT
GLOVE BIOGEL PI IND STRL 7.0 (GLOVE) IMPLANT
GLOVE BIOGEL PI IND STRL 7.5 (GLOVE) ×1 IMPLANT
GLOVE BIOGEL PI INDICATOR 6.5 (GLOVE) ×2
GLOVE BIOGEL PI INDICATOR 7.0 (GLOVE) ×1
GLOVE BIOGEL PI INDICATOR 7.5 (GLOVE) ×2
GLOVE ECLIPSE 7.0 STRL STRAW (GLOVE) ×1 IMPLANT
GLOVE SURG SS PI 6.0 STRL IVOR (GLOVE) ×1 IMPLANT
GLOVE SURG SS PI 7.0 STRL IVOR (GLOVE) ×1 IMPLANT
GOWN STRL REUS W/ TWL LRG LVL3 (GOWN DISPOSABLE) ×3 IMPLANT
GOWN STRL REUS W/ TWL XL LVL3 (GOWN DISPOSABLE) IMPLANT
GOWN STRL REUS W/TWL LRG LVL3 (GOWN DISPOSABLE) ×8
GOWN STRL REUS W/TWL XL LVL3 (GOWN DISPOSABLE) ×2
HEMOSTAT SPONGE AVITENE ULTRA (HEMOSTASIS) ×1 IMPLANT
KIT BASIN OR (CUSTOM PROCEDURE TRAY) ×2 IMPLANT
KIT ROOM TURNOVER OR (KITS) ×2 IMPLANT
KIT SHUNT ARGYLE CAROTID ART 6 (VASCULAR PRODUCTS) ×1 IMPLANT
NDL HYPO 25GX1X1/2 BEV (NEEDLE) IMPLANT
NEEDLE HYPO 25GX1X1/2 BEV (NEEDLE) ×2 IMPLANT
NS IRRIG 1000ML POUR BTL (IV SOLUTION) ×6 IMPLANT
PACK CAROTID (CUSTOM PROCEDURE TRAY) ×2 IMPLANT
PAD ARMBOARD 7.5X6 YLW CONV (MISCELLANEOUS) ×4 IMPLANT
PATCH VASC XENOSURE 1CMX6CM (Vascular Products) ×2 IMPLANT
PATCH VASC XENOSURE 1X6 (Vascular Products) ×1 IMPLANT
SUT ETHILON 3 0 PS 1 (SUTURE) ×1 IMPLANT
SUT MNCRL AB 4-0 PS2 18 (SUTURE) ×2 IMPLANT
SUT PROLENE 6 0 BV (SUTURE) ×5 IMPLANT
SUT VIC AB 3-0 SH 27 (SUTURE) ×2
SUT VIC AB 3-0 SH 27X BRD (SUTURE) ×1 IMPLANT
SYR 20CC LL (SYRINGE) ×2 IMPLANT
SYR TB 1ML LUER SLIP (SYRINGE) ×1 IMPLANT
SYSTEM CHEST DRAIN TLS 7FR (DRAIN) ×1 IMPLANT
TUBE CONNECTING 12X1/4 (SUCTIONS) ×1 IMPLANT
WATER STERILE IRR 1000ML POUR (IV SOLUTION) ×2 IMPLANT

## 2016-10-05 NOTE — Anesthesia Procedure Notes (Signed)
Arterial Line Insertion Start/End8/14/2018 7:10 AM, 10/05/2016 7:15 AM Performed by: Bluford MainSMITH, Sham Alviar A, Mykle Pascua A, CRNA  Patient location: Pre-op. Preanesthetic checklist: patient identified, IV checked, site marked, risks and benefits discussed, surgical consent, monitors and equipment checked, pre-op evaluation, timeout performed and anesthesia consent Lidocaine 1% used for infiltration Left, radial was placed Catheter size: 20 G Hand hygiene performed , maximum sterile barriers used  and Seldinger technique used Allen's test indicative of satisfactory collateral circulation Attempts: 1 Procedure performed without using ultrasound guided technique. Following insertion, dressing applied and Biopatch. Post procedure assessment: normal and unchanged

## 2016-10-05 NOTE — Transfer of Care (Signed)
Immediate Anesthesia Transfer of Care Note  Patient: Adam Andrews  Procedure(s) Performed: Procedure(s): ENDARTERECTOMY RIGHT CAROTID ARTERY (Right) RIGHT CAROTID ARTERY PATCH ANGIOPLASTY USING XENOSURE VASCULAR PATCH (Right)  Patient Location: PACU  Anesthesia Type:General  Level of Consciousness: awake, alert , oriented and patient cooperative  Airway & Oxygen Therapy: Patient Spontanous Breathing and Patient connected to face mask oxygen  Post-op Assessment: Report given to RN, Post -op Vital signs reviewed and stable and Patient moving all extremities X 4  Post vital signs: Reviewed and stable  Last Vitals:  Vitals:   10/05/16 0630  BP: 136/61  Pulse: 67  Resp: 20  Temp: 36.7 C  SpO2: 97%    Last Pain:  Vitals:   10/05/16 0630  TempSrc: Oral         Complications: No apparent anesthesia complications

## 2016-10-05 NOTE — Anesthesia Procedure Notes (Signed)
Procedure Name: Intubation Date/Time: 10/05/2016 7:53 AM Performed by: Annabelle HarmanSMITH, Theoplis Garciagarcia A Pre-anesthesia Checklist: Patient identified, Emergency Drugs available, Suction available and Patient being monitored Patient Re-evaluated:Patient Re-evaluated prior to induction Oxygen Delivery Method: Circle system utilized Preoxygenation: Pre-oxygenation with 100% oxygen Induction Type: IV induction Ventilation: Mask ventilation without difficulty and Oral airway inserted - appropriate to patient size Laryngoscope Size: Hyacinth MeekerMiller and 2 Grade View: Grade II Tube type: Oral Tube size: 7.5 mm Number of attempts: 1 Airway Equipment and Method: Stylet Placement Confirmation: ETT inserted through vocal cords under direct vision and breath sounds checked- equal and bilateral Secured at: 22 cm Tube secured with: Tape Dental Injury: Teeth and Oropharynx as per pre-operative assessment

## 2016-10-05 NOTE — Interval H&P Note (Signed)
   History and Physical Update  The patient was interviewed and re-examined.  The patient's previous History and Physical has been reviewed and is unchanged from my consult except for: interval CTA Neck which demonstrates R ICA stenosis >80%.  His CTA was reviewed by Dr. Danielle DessElsner, who doesn't feel that the CT definitively accounts for his left hand symptoms.  Given the resolution of most of his deficits and recurrent sx in left hand, I recommend: urgent R CEA.  This procedure was delayed to allow reversal of his Brillinta.   I discussed with the patient the risks, benefits, and alternatives to carotid endarterectomy.    His cardiac risk stratification does not favor carotid artery stenting.  The patient is aware that the risks of carotid endarterectomy include but are not limited to: bleeding, infection, stroke, myocardial infarction, death, cranial nerve injuries both temporary and permanent, neck hematoma, possible airway compromise, labile blood pressure post-operatively, cerebral hyperperfusion syndrome, and possible need for additional interventions in the future.   The patient is aware of the risks and agrees to proceed forward with the procedure.   Leonides SakeBrian Nicolis Boody, MD, FACS [facility] Office: [phone] Pager: [phone]  10/05/2016, 7:03 AM

## 2016-10-05 NOTE — H&P (View-Only) (Signed)
New Carotid Patient  Requested by:  Dr. Pearlean Brownie (Neurology)  Reason for referral: right carotid stenosis >70% and R sided CVA   History of Present Illness   Adam Andrews is a 66 y.o. (04-05-1950) male who presents with chief complaint: left arm arm weakness, discoordination and anesthesia and paraesthesia.  Yesterday after noon, the patient develop acute onset of weakness in left arm while brushing his teeth.  He noted numbness and tingling in the either left arm with increasing intensity in the left hand.  He notice duscoordination of the left hand.  The patient reportedly had what sounds like a motor injury in the left shoulder and distant history of a back injury as a child, so he initially dismissed his findings as related to these prior events.  His sx and signs however did not improve and he started developing left facial and tongue anesthesia so presented at the ED for evaluation.  Reportedly, previous carotid studies demonstrated: RICA 60-79% stenosis, LICA 1-39% stenosis.  Patient reports a history of TIA but I'm not certain what he means by this.  The patient has never had amaurosis fugax or monocular blindness.  The patient has never had facial drooping or hemiplegia though he has had the above noted sx.  The patient has never had receptive or expressive aphasia.   The patient notes getting some recurrence of the left hand weakness and numbness today with use of the left arm.  The patient's risks factors for carotid disease include: HTN, HLD.  Additionally the patient is s/p PCI x 1v this Feb 2018 and s/p PCI x 2v in 2010.  Past Medical History:  Diagnosis Date  . CAD (coronary artery disease)   . History of cardiac cath   . Hypertension   HLD   Past Surgical History:  Procedure Laterality Date  . CARDIAC CATHETERIZATION      Social History   Social History  . Marital status: Married    Spouse name: N/A  . Number of children: N/A  . Years of education: N/A    Occupational History  . Not on file.   Social History Main Topics  . Smoking status: Former Games developer  . Smokeless tobacco: Never Used  . Alcohol use Not on file  . Drug use: Unknown  . Sexual activity: Not on file   Other Topics Concern  . Not on file   Social History Narrative  . No narrative on file    Family History  Problem Relation Age of Onset  . CAD Sister   . CAD Brother   . Stroke Brother     Current Facility-Administered Medications  Medication Dose Route Frequency Provider Last Rate Last Dose  . acetaminophen (TYLENOL) tablet 650 mg  650 mg Oral Q4H PRN Therisa Doyne, MD       Or  . acetaminophen (TYLENOL) solution 650 mg  650 mg Per Tube Q4H PRN Doutova, Anastassia, MD       Or  . acetaminophen (TYLENOL) suppository 650 mg  650 mg Rectal Q4H PRN Doutova, Anastassia, MD      . ALPRAZolam Prudy Feeler) tablet 0.5 mg  0.5 mg Oral PRN Therisa Doyne, MD   0.5 mg at 09/29/16 2026  . aspirin chewable tablet 81 mg  81 mg Oral Daily Doutova, Anastassia, MD   81 mg at 09/30/16 0955  . carvedilol (COREG) tablet 12.5 mg  12.5 mg Oral Q breakfast Doutova, Anastassia, MD   12.5 mg at 09/30/16 0955  . senna-docusate (  Senokot-S) tablet 1 tablet  1 tablet Oral QHS PRN Doutova, Anastassia, MD      . tamsulosin (FLOMAX) capsule 0.4 mg  0.4 mg Oral Daily Doutova, Anastassia, MD   0.4 mg at 09/30/16 0955  . ticagrelor (BRILINTA) tablet 90 mg  90 mg Oral BID Therisa Doyne, MD   90 mg at 09/30/16 0955    No Known Allergies   REVIEW OF SYSTEMS (negative unless checked):   Cardiac:  []  Chest pain or chest pressure? []  Shortness of breath upon activity? []  Shortness of breath when lying flat? []  Irregular heart rhythm?  Vascular:  []  Pain in calf, thigh, or hip brought on by walking? []  Pain in feet at night that wakes you up from your sleep? []  Blood clot in your veins? []  Leg swelling?  Pulmonary:  []  Oxygen at home? []  Productive cough? []   Wheezing?  Neurologic:  [x]  Sudden weakness in arms or legs? [x]  Sudden numbness in arms or legs? []  Sudden onset of difficult speaking or slurred speech? []  Temporary loss of vision in one eye? []  Problems with dizziness?  Gastrointestinal:  []  Blood in stool? []  Vomited blood?  Genitourinary:  []  Burning when urinating? []  Blood in urine?  Psychiatric:  []  Major depression  Hematologic:  []  Bleeding problems? []  Problems with blood clotting?  Dermatologic:  []  Rashes or ulcers?  Constitutional:  []  Fever or chills?  Ear/Nose/Throat:  []  Change in hearing? []  Nose bleeds? []  Sore throat?  Musculoskeletal:  []  Back pain? []  Joint pain? []  Muscle pain?   For VQI Use Only   PRE-ADM LIVING Home  AMB STATUS Ambulatory  CAD Sx History of MI, but no symptoms MI < 6 months ago  PRIOR CHF None  STRESS TEST No    Physical Examination     Vitals:   09/30/16 1000 09/30/16 1248 09/30/16 1406 09/30/16 1635  BP: (!) 143/65 133/66 125/64 131/78  Pulse: 77 78 73 77  Resp: 16 15 18 18   Temp: 98.1 F (36.7 C) 98.6 F (37 C) 98 F (36.7 C) 98 F (36.7 C)  TempSrc: Oral Oral Oral Oral  SpO2: 96% 96% 95% 96%  Weight:      Height:       Body mass index is 28.93 kg/m.  General Alert, O x 3, WD, NAD  Head [place]/AT,    Ear/Nose/ Throat Hearing grossly intact, nares without erythema or drainage, oropharynx without Erythema or Exudate, Mallampati score: 3,   Eyes PERRLA, EOMI,    Neck Supple, mid-line trachea,    Pulmonary Sym exp, good B air movt, CTA B  Cardiac RRR, Nl S1, S2, no Murmurs, No rubs, No S3,S4  Vascular Vessel Right Left  Radial Palpable Palpable  Brachial Palpable Palpable  Carotid Palpable, No Bruit Palpable, No Bruit  Aorta Not palpable N/A  Femoral Palpable Palpable  Popliteal Not palpable Not palpable  PT Palpable Palpable  DP Palpable Palpable    Gastro- intestinal soft, non-distended, non-tender to palpation, No guarding or rebound, no  HSM, no masses, no CVAT B, No palpable prominent aortic pulse,    Musculo- skeletal M/S 5/5 throughout except mildly weaker L hand grip 4-5/5  , Extremities without ischemic changes  , No edema present, No visible varicosities , No Lipodermatosclerosis present  Neurologic Cranial nerves 2-12 intact , Pain and light touch intact in extremities , Motor exam as listed above  Psychiatric Judgement intact, Mood & affect appropriate for pt's clinical situation  Dermatologic See M/S  exam for extremity exam, No rashes otherwise noted  Lymphatic  Palpable lymph nodes: None     Non-invasive Vascular Imaging   B Carotid Duplex (09/30/16):  R ICA stenosis:  60-79% R VA: patent and antegrade L ICA stenosis:  1-39% L VA: patent and bidirectional  TTE (09/30/16)  EF 55-60%  No cardiac source of emboli was indentified.   Laboratory   CBC CBC Latest Ref Rng & Units 09/30/2016 09/29/2016 09/29/2016  WBC 4.0 - 10.5 K/uL 6.1 - 7.0  Hemoglobin 13.0 - 17.0 g/dL 19.1 47.8 29.5  Hematocrit 39.0 - 52.0 % 38.9(L) 41.0 39.4  Platelets 150 - 400 K/uL 188 - 209    BMP BMP Latest Ref Rng & Units 09/30/2016 09/29/2016 09/29/2016  Glucose 65 - 99 mg/dL 621(H) 086(V) 784(O)  BUN 6 - 20 mg/dL 8 10 8   Creatinine 0.61 - 1.24 mg/dL 9.62 9.52 8.41  Sodium 135 - 145 mmol/L 141 141 139  Potassium 3.5 - 5.1 mmol/L 3.5 3.9 3.9  Chloride 101 - 111 mmol/L 106 105 106  CO2 22 - 32 mmol/L 25 - 25  Calcium 8.9 - 10.3 mg/dL 3.2(G) - 9.3    Coagulation Lab Results  Component Value Date   INR 1.00 09/29/2016   No results found for: PTT  Lipids    Component Value Date/Time   CHOL 169 09/30/2016 0431   TRIG 171 (H) 09/30/2016 0431   HDL 32 (L) 09/30/2016 0431   CHOLHDL 5.3 09/30/2016 0431   VLDL 34 09/30/2016 0431   LDLCALC 103 (H) 09/30/2016 0431     Radiology     Dg Chest 2 View  Result Date: 09/30/2016 CLINICAL DATA:  Transient ischemic attack EXAM: CHEST  2 VIEW COMPARISON:  None. FINDINGS: Minimal linear  scarring or atelectasis is noted at the left costophrenic angle. No pneumonia or effusion is seen. Mediastinal and hilar contours are unremarkable. The heart is within normal limits in size. No bony abnormality is seen. IMPRESSION: No active cardiopulmonary disease. Electronically Signed   By: Dwyane Dee M.D.   On: 09/30/2016 08:05   Mr Maxine Glenn Head Wo Contrast  Result Date: 09/29/2016 CLINICAL DATA:  66 y/o M; left facial and left arm numbness with weakness. EXAM: MRI HEAD WITHOUT CONTRAST MRA HEAD WITHOUT CONTRAST TECHNIQUE: Multiplanar, multiecho pulse sequences of the brain and surrounding structures were obtained without intravenous contrast. Angiographic images of the head were obtained using MRA technique without contrast. COMPARISON:  None. FINDINGS: MRI HEAD FINDINGS Brain: Subcentimeter foci of reduced diffusion centered around the right superolateral central sulcus in the motor and sensory cortices (series 3, image 38). No acute hemorrhage. Small chronic infarction in right postcentral gyrus an within left mid corona radiata. No focal mass effect, extra-axial collection, or hydrocephalus. Vascular: As below. Skull and upper cervical spine: Normal marrow signal. Sinuses/Orbits: Negative. Other: None. MRA HEAD FINDINGS Anterior circulation: No large vessel occlusion, aneurysm, or significant stenosis is identified. Mild irregularity of bilateral paraclinoid internal carotid arteries without significant stenosis likely representing intracranial atherosclerosis. Posterior circulation: No large vessel occlusion, aneurysm, or significant stenosis is identified. Right dominant vertebrobasilar system. Anatomic variant: Anterior communicating artery and right posterior communicating arteries are patent. Left AICA/ PICA. IMPRESSION: MRI of the head: 1. Subcentimeter acute/early subacute infarction centered along right superolateral central sulcus in motor and sensory cortices. No acute hemorrhage. 2. Small chronic  cortical infarction in the right postcentral gyrus and within the left mid corona radiata. MRA of the head: Patent circle of Willis. No  large vessel occlusion, aneurysm, or significant stenosis is identified. These results will be called to the ordering clinician or representative by the Radiologist Assistant, and communication documented in the PACS or zVision Dashboard. Electronically Signed   By: Mitzi HansenLance  Furusawa-Stratton M.D.   On: 09/29/2016 22:35   Mr Brain Wo Contrast  Result Date: 09/29/2016 CLINICAL DATA:  66 y/o M; left facial and left arm numbness with weakness. EXAM: MRI HEAD WITHOUT CONTRAST MRA HEAD WITHOUT CONTRAST TECHNIQUE: Multiplanar, multiecho pulse sequences of the brain and surrounding structures were obtained without intravenous contrast. Angiographic images of the head were obtained using MRA technique without contrast. COMPARISON:  None. FINDINGS: MRI HEAD FINDINGS Brain: Subcentimeter foci of reduced diffusion centered around the right superolateral central sulcus in the motor and sensory cortices (series 3, image 38). No acute hemorrhage. Small chronic infarction in right postcentral gyrus an within left mid corona radiata. No focal mass effect, extra-axial collection, or hydrocephalus. Vascular: As below. Skull and upper cervical spine: Normal marrow signal. Sinuses/Orbits: Negative. Other: None. MRA HEAD FINDINGS Anterior circulation: No large vessel occlusion, aneurysm, or significant stenosis is identified. Mild irregularity of bilateral paraclinoid internal carotid arteries without significant stenosis likely representing intracranial atherosclerosis. Posterior circulation: No large vessel occlusion, aneurysm, or significant stenosis is identified. Right dominant vertebrobasilar system. Anatomic variant: Anterior communicating artery and right posterior communicating arteries are patent. Left AICA/ PICA. IMPRESSION: MRI of the head: 1. Subcentimeter acute/early subacute infarction  centered along right superolateral central sulcus in motor and sensory cortices. No acute hemorrhage. 2. Small chronic cortical infarction in the right postcentral gyrus and within the left mid corona radiata. MRA of the head: Patent circle of Willis. No large vessel occlusion, aneurysm, or significant stenosis is identified. These results will be called to the ordering clinician or representative by the Radiologist Assistant, and communication documented in the PACS or zVision Dashboard. Electronically Signed   By: Mitzi HansenLance  Furusawa-Stratton M.D.   On: 09/29/2016 22:35   Ct Head Code Stroke Wo Contrast`  Result Date: 09/30/2016 CLINICAL DATA:  Code stroke.  Acute onset of left-sided weakness. EXAM: CT HEAD WITHOUT CONTRAST TECHNIQUE: Contiguous axial images were obtained from the base of the skull through the vertex without intravenous contrast. COMPARISON:  MRI brain 11/20/2008 FINDINGS: Brain: No acute infarct, hemorrhage, or mass lesion is present. The ventricles are of normal size. No significant extra-axial fluid collection is present. No significant white matter disease is present. The basal ganglia are intact. Insular ribbon is normal bilaterally. The brainstem and cerebellum are normal. Vascular: Minimal vascular calcifications are present without a hyperdense vessel. Skull: The calvarium is intact. No significant extracranial soft tissue injury is present. Sinuses/Orbits: The paranasal sinuses and mastoid air cells are clear. The globes and orbits are unremarkable. ASPECTS [facility]([place] Stroke Program Early CT Score) - Ganglionic level infarction (caudate, lentiform nuclei, internal capsule, insula, M1-M3 cortex): 7/7 - Supraganglionic infarction (M4-M6 cortex): 3/3 Total score (0-10 with 10 being normal): 10/10 IMPRESSION: 1. Negative CT of the head. 2. ASPECTS is 10/10 These results were called by telephone at the time of interpretation on 09/29/2016 at 4:46 pm to Dr. Ritta SlotMCNEILL KIRKPATRICK , who verbally  acknowledged these results. Electronically Signed   By: Marin Robertshristopher  Mattern M.D.   On: 09/30/2016 07:41     Medical Decision Making   Olive BassLeonard Muckle is a 66 y.o. male who presents with: R CVA, possible sx RICA stenosis >70%, s/p recent PCI for recent MI, reported statin intolerance vs allergy   I usually  verify the B carotid duplex findings with CTA neck when the carotid duplex is positive.  I will order the test.  If R ICA stenosis >50%, likely will need R CEA vs CAS in 2-4 weeks.  It's not clear to me if this patient's residual sx are recurrent sx vs residual sx.  If it was felt, his signs and sx were worsening, then might make the argument to proceed with intervention.  However, peri-stroke interventions have been associated with worsening CVA and hemorrhagic conversion.  Given the recent MI, the cardiac risk stratification will factor into the choice of intervention.  In the event the patient risk stratifies into high risk, would plan B carotid and cerebral angiogram to determine if candidate for CAS vs TCAR.  Ideally the Brillinta needs to be temporarily changed to Plavix to limit bleeding risk, as my experiences is that interventions on Brillinta are frequently complicated by poorly controlled bleeding. I discussed in depth with the patient the nature of atherosclerosis, and emphasized the importance of maximal medical management including strict control of blood pressure, blood glucose, and lipid levels, obtaining regular exercise, antiplatelet agents, and cessation of smoking.   The patient is currently not on a statin due to reported allergy.  It sounds as if the patient was going to be trialed on low dose Crestor.  I will defer to cardiology The patient is currently on ASA and Brillinta.  Would prefer Plavix and ASA if ok with Cardiology.  The patient is aware that without maximal medical management the underlying atherosclerotic disease process will progress, limiting the  benefit of any interventions.  Additional, decision making dependent on CTA neck and Cardiology consult.  Thank you for allowing [place] to participate in this patient's care.   Leonides Sake, MD, FACS [facility] Office: [phone] Pager: [phone]  09/30/2016, 4:49 PM

## 2016-10-05 NOTE — Op Note (Signed)
OPERATIVE NOTE  PROCEDURE:   1.  right carotid endarterectomy with bovine patch angioplasty 2.  right intraoperative carotid ultrasound  PRE-OPERATIVE DIAGNOSIS: right symptomatic carotid stenosis >80%  POST-OPERATIVE DIAGNOSIS: same as above   SURGEON: Leonides Sake, MD  ASSISTANT(S): Lianne Cure, PAC   ANESTHESIA: general  ESTIMATED BLOOD LOSS: 100 cc  FINDING(S): 1.  Continuous Doppler audible flow signatures are appropriate for each carotid artery. 2.  No evidence of intimal flap visualized on transverse or longitudinal ultrasonography. 3.  Carotid plaque: necrotic core, near occluded,  4.  Vagus nerve: normal position 5.  Hypoglossal nerve: not visualized,   SPECIMEN(S):  None  INDICATIONS:   Adam Andrews is a 66 y.o. male who presents with right symptomatic carotid stenosis >80%.  I discussed with the patient the risks, benefits, and alternatives to carotid endarterectomy.  Patient was felt to be an acceptable risk for right carotid endarterectomy by Cardiology.  I discussed the procedural details of carotid endarterectomy with the patient.  The patient is aware that the risks of carotid endarterectomy include but are not limited to: bleeding, infection, stroke, myocardial infarction, death, cranial nerve injuries both temporary and permanent, neck hematoma, possible airway compromise, labile blood pressure post-operatively, cerebral hyperperfusion syndrome, and possible need for additional interventions in the future. The patient is aware of the risks and agrees to proceed forward with the procedure.   DESCRIPTION: After full informed written consent was obtained from the patient, the patient was brought back to the operating room and placed supine upon the operating table.  Prior to induction, the patient received IV antibiotics.  After obtaining adequate anesthesia, the patient was placed into semi-Fowler position with a shoulder roll in place and the patient's  neck slightly hyperextended and rotated away from the surgical site.    The patient was prepped in the standard fashion for a right carotid endarterectomy.  I made an incision anterior to the sternocleidomastoid muscle and dissected down through the subcutaneous tissue.  The platysmas was opened with electrocautery.  Then I dissected down to the internal jugular vein.  This was dissected posteriorly until I obtained visualization of the common carotid artery.  This was dissected out and then an umbilical tape was placed around the common carotid artery and I loosely applied a Rumel tourniquet.  I then dissected in a periadventitial fashion along the common carotid artery up to the bifurcation.  I then identified the external carotid artery and the superior thyroid artery.  A 2-0 silk tie was looped around the superior thyroid artery, and I also dissected out the external carotid artery and placed a vessel loop around it.  In continuing the dissection to the internal carotid artery, I identified the facial vein.  This was ligated and then transected, giving me improved exposure of the internal carotid artery.  In the process of this dissection, the hypoglossal nerve was identified.  I then dissected out the internal carotid artery until I identified an area of soft tissue in the internal carotid artery.  I dissected slightly distal to this area, and placed an umbilical tape around the artery and loosely applied a Rumel tourniquet.    The patient was given 9500 units of Heparin intravenously, which was a therapeutic bolus. An additional 2000 units of Heparin was administered every hour after initial bolus to maintain anticoagulation.  In total, 96045 units of Heparin was administrated to achieve and maintain a therapeutic level of anticoagulation.  After waiting 3 minutes, then I  clamped the internal carotid artery, external carotid artery and then the common carotid artery.  I then made an arteriotomy in the  common carotid artery with a 11 blade, and extended the arteriotomy with a Potts scissor down into the common carotid artery, then I carried the arteriotomy through the bifurcation into the internal carotid artery until I reached an area that was not diseased.  This was somewhat difficult as the plaque extended more proximally than predicted by the CTA Neck.  Eventually, I was able to get into a segment that was patent.  At this point, I took the 10 shunt that previously been prepared and I inserted it into the internal carotid artery.  The Rumel tourniquet was then applied to this end of the shunt.  I unclamped the shunt to verify retrograde blood flow in the internal carotid artery.  I then placed the other end of the shunt into the common carotid artery after unclamping the artery.  The Rumel was tightened down around the shunt.  At this point, I verified blood flow in the shunt with a continuous doppler.  At this point, I started the endarterectomy in the common carotid artery with a Cytogeneticistenfield dissector and carried this dissection down into the common carotid artery circumferentially.  Then I transected the plaque at a segment where it was adherent.  I then carried this dissection up into the external carotid artery.  The plaque was extracted by unclamping the external carotid artery and everting the artery.  The dissection was then carried into the internal carotid artery, extracting the remaining portion of the carotid plaque.  I passed the plaque off the field.    I then spent the next 30 minutes removing intimal flaps and loose debris.  Eventually I reached the point where the residual plaque was densely adherent and any further dissection would compromise the integrity of the wall.  After verifying that there was no more loose intimal flaps or debris, I re-interrogated the entirety of this carotid artery.  At this point, I was satisfied that the minimal remaining disease was densely adherent to the wall  and wall integrity was intact.  At this point, I then fashioned a bovine pericardial patch for the geometry of this artery and sewed it in place with two running stitch of 6-0 Prolene, one from each end.  Prior to completing this patch angioplasty, I removed the shunt first from the internal carotid artery, from which there was excellent backbleeding, and clamped it.  Then I removed the shunt from the common carotid artery, from which there was excellent antegrade bleeding, and then clamped it.  At this point, I allowed the external carotid artery to backbleed, which was excellent.  Then I instilled heparinized saline in this patched artery and then completed the patch angioplasty in the usual fashion.    At this point, I first released the clamp on the external carotid artery, then I released it on the common carotid artery.  After waiting a few seconds, I then released it on the internal carotid artery.  I then interrogated this patient's arteries with the continuous Doppler.  The audible waveforms in each artery were consistent with the expected characteristics for each artery.  The Sonosite probe was then sterilely draped and used to interrogate the carotid artery in both longitudinal and transverse views.  At this point, I washed out the wound, and placed Avitene throughout.  I also gave the patient 50 mg of protamine to reverse his anticoagulation.  After waiting a few minutes, I removed the Avitene and washed out the wound.  There was no more active bleeding in the surgical site.     Given the patient recent discontinuation of Brillinta, I felt drain placement was necessary.  I passed the TLS trocar through the subcutaneous tissue inferior to the incision line.  I pulled the drain to appropriate position and secured it with a 3-0 Nylon suture tied to the drain.  I shortened the drain to appropriate length and place it adjacent to the artery and nerve.  The exterior hardware was attached to the drain and  the test tube was not attached until the skin was close.    I then reapproximated the platysma muscle with a running stitch of 3-0 Vicryl.  The skin was then reapproximated with a running subcuticular 4-0 Monocryl stitch.  The skin was then cleaned, dried and LiquidBand was used to reinforce the skin closure.    The patient woke without any problems, neurologically intact.    COMPLICATIONS: none  CONDITION: stable   Leonides Sake, MD, [facility] [place] Office: [phone] Pager: [phone]  10/05/2016, 10:41 AM

## 2016-10-05 NOTE — Anesthesia Postprocedure Evaluation (Signed)
Anesthesia Post Note  Patient: Adam Andrews  Procedure(s) Performed: Procedure(s) (LRB): ENDARTERECTOMY RIGHT CAROTID ARTERY (Right) RIGHT CAROTID ARTERY PATCH ANGIOPLASTY USING XENOSURE VASCULAR PATCH (Right)     Patient location during evaluation: PACU Anesthesia Type: General Level of consciousness: awake and alert Pain management: pain level controlled Vital Signs Assessment: post-procedure vital signs reviewed and stable Respiratory status: spontaneous breathing, nonlabored ventilation, respiratory function stable and patient connected to nasal cannula oxygen Cardiovascular status: blood pressure returned to baseline and stable Postop Assessment: no signs of nausea or vomiting Anesthetic complications: no    Last Vitals:  Vitals:   10/05/16 1210 10/05/16 1225  BP: (!) 100/55 (!) 101/53  Pulse: 65 65  Resp: 12 12  Temp:    SpO2: 95% 95%    Last Pain:  Vitals:   10/05/16 1247  TempSrc:   PainSc: 5                  Beryle Lathehomas E Shebra Muldrow

## 2016-10-06 ENCOUNTER — Telehealth: Payer: Self-pay | Admitting: Vascular Surgery

## 2016-10-06 ENCOUNTER — Encounter (HOSPITAL_COMMUNITY): Payer: Self-pay | Admitting: Vascular Surgery

## 2016-10-06 LAB — CBC
HCT: 33.5 % — ABNORMAL LOW (ref 39.0–52.0)
Hemoglobin: 11.4 g/dL — ABNORMAL LOW (ref 13.0–17.0)
MCH: 33.5 pg (ref 26.0–34.0)
MCHC: 34 g/dL (ref 30.0–36.0)
MCV: 98.5 fL (ref 78.0–100.0)
PLATELETS: 195 10*3/uL (ref 150–400)
RBC: 3.4 MIL/uL — ABNORMAL LOW (ref 4.22–5.81)
RDW: 13.8 % (ref 11.5–15.5)
WBC: 11.1 10*3/uL — ABNORMAL HIGH (ref 4.0–10.5)

## 2016-10-06 LAB — BASIC METABOLIC PANEL
Anion gap: 8 (ref 5–15)
BUN: 10 mg/dL (ref 6–20)
CALCIUM: 8.5 mg/dL — AB (ref 8.9–10.3)
CO2: 25 mmol/L (ref 22–32)
Chloride: 105 mmol/L (ref 101–111)
Creatinine, Ser: 0.89 mg/dL (ref 0.61–1.24)
GFR calc Af Amer: >60 mL/min (ref >60)
Glucose, Bld: 126 mg/dL — ABNORMAL HIGH (ref 65–99)
Potassium: 4 mmol/L (ref 3.5–5.1)
SODIUM: 138 mmol/L (ref 135–145)

## 2016-10-06 MED ORDER — OXYCODONE-ACETAMINOPHEN 5-325 MG PO TABS: 1.0000 | ORAL_TABLET | ORAL | 0 refills | Status: DC | PRN

## 2016-10-06 NOTE — Progress Notes (Signed)
Discharged to home with spouse. Verbalized all written and verbal discharge instructions. Left unit via wheelchair accompanied by spouse and volunteer transportation. Written paper prescription given for pain medication.

## 2016-10-06 NOTE — Care Management Note (Signed)
Case Management Note Donn PieriniKristi Koralyn Prestage RN, BSN Unit 4E-Case Manager [phone]  Patient Details  Name: Adam CapesLeonard L Eckels MRN: [id number] Date of Birth: [birth year]  Subjective/Objective:    Pt admitted s/p CEA                Action/Plan: PTA pt lived at home - no CM needs noted for discharge.   Expected Discharge Date:  10/06/16               Expected Discharge Plan:  Home/Self Care  In-House Referral:  NA  Discharge planning Services  CM Consult  Post Acute Care Choice:  NA Choice offered to:  NA  DME Arranged:  N/A DME Agency:  NA  HH Arranged:  NA HH Agency:  NA  Status of Service:  Completed, signed off  If discussed at Long Length of Stay Meetings, dates discussed:    Discharge Disposition: home/self care   Additional Comments:  Darrold SpanWebster, Brook Geraci Hall, RN 10/06/2016, 11:29 AM

## 2016-10-06 NOTE — Progress Notes (Signed)
   Daily Progress Note   Assessment/Planning:   POD #1 s/p R CEA   D/C TLS drain  Ok to D/C   Subjective  - 1 Day Post-Op   No complaints, pt has voided, ambulated, and ate   Objective   Vitals:   10/05/16 2056 10/05/16 2315 10/05/16 2345 10/06/16 0435  BP: (!) 116/53 106/61  (!) 111/55  Pulse: 73 62 63 68  Resp: 15 15 15 16   Temp: 97.9 F (36.6 C)  97.9 F (36.6 C) 98.2 F (36.8 C)  TempSrc: Oral  Oral Oral  SpO2: 97% 97% 95% 94%  Weight:    215 lb (97.5 kg)  Height:         Intake/Output Summary (Last 24 hours) at 10/06/16 0814 Last data filed at 10/06/16 0500  Gross per 24 hour  Intake          3516.67 ml  Output             1925 ml  Net          1591.67 ml    PULM  CTAB  CV  RRR  GI  soft, NTND  VASC Neck inc c/d/i, minimal TLS output, no hematoma  NEURO CN grossly intact, midline tongue, sym M/S 5/5    Laboratory   CBC CBC Latest Ref Rng & Units 10/06/2016 10/05/2016 10/04/2016  WBC 4.0 - 10.5 K/uL 11.1(H) 5.1 8.0  Hemoglobin 13.0 - 17.0 g/dL 11.4(L) 11.2(L) 14.0  Hematocrit 39.0 - 52.0 % 33.5(L) 32.7(L) 39.7  Platelets 150 - 400 K/uL 195 167 227    BMET    Component Value Date/Time   NA 138 10/06/2016 0509   K 4.0 10/06/2016 0509   CL 105 10/06/2016 0509   CO2 25 10/06/2016 0509   GLUCOSE 126 (H) 10/06/2016 0509   BUN 10 10/06/2016 0509   CREATININE 0.89 10/06/2016 0509   CALCIUM 8.5 (L) 10/06/2016 0509   GFRNONAA >60 10/06/2016 0509   GFRAA >60 10/06/2016 [id number]     Leonides SakeBrian Chen, MD, FACS [facility] [place] Office: [phone] Pager: [phone]  10/06/2016, 8:14 AM

## 2016-10-06 NOTE — Progress Notes (Signed)
Removed Blake drain from right side of neck.  Patient laying flat  In bed, head turned toward left side, removed sutures with sterile scissors, instructed patient to take deep breath, drain removed easily with no resistance, held pressure for 5 minutes, placed sterile gauze over exit site, secured with  medipore tape. Laying flat for 30 minutes, no signs of bleeding or pain.

## 2016-10-06 NOTE — Telephone Encounter (Signed)
LVM on home # for post on 9/14 mailed letter to home address

## 2016-10-06 NOTE — Telephone Encounter (Signed)
-----   Message from Sharee PimpleMarilyn K McChesney, RN sent at 10/05/2016 10:53 AM EDT ----- Regarding: 2-3 weeks post right CEA   ----- Message ----- From: Lars Mageollins, Emma M, PA-C Sent: 10/05/2016  10:51 AM To: Vvs Charge Pool  F/U with Dr. Imogene Burnhen in 2-3 weeks s/p right CEA

## 2016-10-07 NOTE — Discharge Summary (Signed)
[facility] Discharge Summary   Patient ID:  Adam Andrews MRN: [id number] DOB/AGE: [birth year] 66 y.o.  Admit date: 10/05/2016 Discharge date: 10/06/2016 Date of Surgery: 10/05/2016 Surgeon: Moishe Spice): [name], MD  Admission Diagnosis: Right carotid stenosis  Discharge Diagnoses:  Right carotid stenosis  Secondary Diagnoses: Past Medical History:  Diagnosis Date  . CAD (coronary artery disease)   . Cardiomyopathy   . Carotid stenosis, right   . Cataract    B/L  . Cerebrovascular disease   . Chronic pain    B/LLE  . Coronary artery disease   . Dyslipidemia   . Enlarged prostate   . History of cardiac cath   . Hypertension    Benign  . Hypertension   . Myocardial infarction (HCC)   . Numbness and tingling in left hand   . S/P angioplasty with stent, 04/15/16  DES to prox LAD 04/17/2016  . Stroke (HCC)   . TIA (transient ischemic attack)     Procedure(s): ENDARTERECTOMY RIGHT CAROTID ARTERY RIGHT CAROTID ARTERY PATCH ANGIOPLASTY USING XENOSURE VASCULAR PATCH  Discharged Condition: good  HPI: On 09/30/2016  Adam Andrews is a 66 y.o. ([birth year]) male who presents with chief complaint: left arm arm weakness, discoordination and anesthesia and paraesthesia.  Yesterday after noon, the patient develop acute onset of weakness in left arm while brushing his teeth.  He noted numbness and tingling in the either left arm with increasing intensity in the left hand.  He notice duscoordination of the left hand.  The patient reportedly had what sounds like a motor injury in the left shoulder and distant history of a back injury as a child, so he initially dismissed his findings as related to these prior events.  His sx and signs however did not improve and he started developing left facial and tongue anesthesia so presented at the ED for evaluation.  B Carotid Duplex (09/30/16):   R ICA stenosis:  60-79%  R VA: patent and antegrade  L ICA stenosis:   1-39%  L VA: patent and bidirectional  TTE (09/30/16)  EF 55-60%  No cardiac source of emboli was indentified.  He return on 10/05/2016 for right CEA.   Hospital Course:  Adam Andrews is a 66 y.o. male is S/P Right Procedure(s): ENDARTERECTOMY RIGHT CAROTID ARTERY RIGHT CAROTID ARTERY PATCH ANGIOPLASTY USING XENOSURE VASCULAR PATCH  POD# 1 stable disposition   healing incision without hematoma, no tongue deviation and symmetric smile. Grip 5/5  Significant Diagnostic Studies: CBC Lab Results  Component Value Date   WBC 11.1 (H) 10/06/2016   HGB 11.4 (L) 10/06/2016   HCT 33.5 (L) 10/06/2016   MCV 98.5 10/06/2016   PLT 195 10/06/2016    BMET    Component Value Date/Time   NA 138 10/06/2016 0509   K 4.0 10/06/2016 0509   CL 105 10/06/2016 0509   CO2 25 10/06/2016 0509   GLUCOSE 126 (H) 10/06/2016 0509   BUN 10 10/06/2016 0509   CREATININE 0.89 10/06/2016 0509   CALCIUM 8.5 (L) 10/06/2016 0509   GFRNONAA >60 10/06/2016 0509   GFRAA >60 10/06/2016 0509   COAG Lab Results  Component Value Date   INR 1.00 09/29/2016   INR 1.06 04/16/2016   INR 1.16 04/15/2016     Disposition:  Discharge to :Home Discharge Instructions    Call MD for:  redness, tenderness, or signs of infection (pain, swelling, bleeding, redness, odor or green/yellow discharge around incision site)    Complete by:  As directed    Call MD for:  severe or increased pain, loss or decreased feeling  in affected limb(s)    Complete by:  As directed    Call MD for:  temperature >100.5    Complete by:  As directed    Discharge instructions    Complete by:  As directed    You  May shower in 24 hours   Driving Restrictions    Complete by:  As directed    No driving for 1 week   Increase activity slowly    Complete by:  As directed    Walk with assistance use walker or cane as needed   Lifting restrictions    Complete by:  As directed    No heavy lifting for 3 weeks   Resume previous  diet    Complete by:  As directed      Allergies as of 10/06/2016   No Known Allergies     Medication List    TAKE these medications   aspirin 81 MG chewable tablet Chew 1 tablet (81 mg total) by mouth daily. What changed:  when to take this   carvedilol 12.5 MG tablet Commonly known as:  COREG Take 1 tablet (12.5 mg total) by mouth 2 (two) times daily with a meal.   [employer] VIT D 5000 HIGH-POTENCY 5000 units capsule Generic drug:  Cholecalciferol Take 5,000 Units by mouth at bedtime.   lisinopril 10 MG tablet Commonly known as:  PRINIVIL,ZESTRIL Take 1 tablet (10 mg total) by mouth daily.   multivitamin with minerals Tabs tablet Take 1 tablet by mouth daily.   nitroGLYCERIN 0.4 MG SL tablet Commonly known as:  NITROSTAT Place 1 tablet (0.4 mg total) under the tongue every 5 (five) minutes as needed. For chest pain.  May repeat times three. What changed:  reasons to take this  additional instructions   oxyCODONE-acetaminophen 5-325 MG tablet Commonly known as:  PERCOCET/ROXICET Take 1 tablet by mouth every 4 (four) hours as needed for moderate pain.   oxymetazoline 0.05 % nasal spray Commonly known as:  AFRIN Place 1 spray into both nostrils 2 (two) times daily as needed for congestion (sinus pressure).   tamsulosin 0.4 MG Caps capsule Commonly known as:  FLOMAX Take 1 capsule (0.4 mg total) by mouth daily. What changed:  when to take this  reasons to take this   ticagrelor 90 MG Tabs tablet Commonly known as:  BRILINTA Take 1 tablet (90 mg total) by mouth 2 (two) times daily.      Verbal and written Discharge instructions given to the patient. Wound care per Discharge AVS Follow-up Information    [name], MD Follow up in 2 week(s).   Specialties:  Vascular Surgery, Cardiology Why:  Office will call you to arrange your appt (sent) Contact information: [address] [place] [address] [phone]           Signed: Clinton Gallant  [facility] 10/07/2016, 3:26 PM   Addendum  I have independently interviewed and examined the patient, and I agree with the physician assistant's discharge summary.  This patient had R CVA with recurrent left arm sx.  His R ICA appeared to have >80% stenosis with possible vulnerable plaque vs thrombus on CTA.  We decided to proceed with R CEA due to recurrent TIA after his Brillinta had lost therapeutic levels.  His R CEA was unremarkable.  On discharge, POD #1, the patient was intact neurologically without any swallow issues and  continued resolution of left arm sx.  The patient will be seen in the office in 2 weeks for wound check.  Leonides SakeBrian Chen, MD, FACS [facility] [place] Office: [phone] Pager: [phone]  10/08/2016, 7:04 AM   --- For VQI Registry use --- Instructions: Press F2 to tab through selections.  Delete question if not applicable.   Modified Rankin score at D/C (0-6): Rankin Score=0  IV medication needed for:  1. Hypertension: No 2. Hypotension: No  Post-op Complications: No  1. Post-op CVA or TIA: No  2. CN injury: No  3. Myocardial infarction: No  4.  CHF: No  5.  Dysrhythmia (new): No  6. Wound infection: No  7. Reperfusion symptoms: No  8. Return to OR: No  If yes: return to OR for (bleeding, neurologic, other CEA incision, other):   Discharge medications: Statin use:  No  for medical reason   ASA use:  Yes Beta blocker use:  Yes ACE-Inhibitor use:  Yes P2Y12 Antagonist use: [ ]  None, [ ]  Plavix, [ ]  Plasugrel, [ ]  Ticlopinine, [x ] Ticagrelor, [ ]  Other, [ ]  No for medical reason, [ ]  Non-compliant, [ ]  Not-indicated Anti-coagulant use:  [ x] None, [ ]  Warfarin, [ ]  Rivaroxaban, [ ]  Dabigatran, [ ]  Other, [ ]  No for medical reason, [ ]  Non-compliant, [ ]  Not-indicated

## 2016-11-02 NOTE — Progress Notes (Signed)
    Postoperative Visit   History of Present Illness   Adam Andrews is a 66 y.o. male who presents for postoperative follow-up for: right CEA for R sx carotid stenosis >80% (Date: 10/05/16).  The patient's neck incision is healed.  The patient has had no further stroke or TIA symptoms.  He notes some musculoskeletal complaints inside his mouth and some residual limited anesthesia near the incision.   For VQI Use Only   PRE-ADM LIVING: Home  AMB STATUS: Ambulatory   Physical Examination   Vitals:   11/05/16 0834 11/05/16 0836  BP: 125/73 125/75  Pulse: 75   Resp: 20   Temp: (!) 97.3 F (36.3 C)   TempSrc: Oral   SpO2: 94%   Weight: 202 lb (91.6 kg)   Height: 5\' 10"  (1.778 m)     right Neck: Incision is healed  Neuro: CN 2-12 are intact , Motor strength is 5/5 bilaterally, sensation is grossly intact   Medical Decision Making   Adam Andrews is a 66 y.o. male who presents s/p right CEA.   The patient's neck incision is healing with no stroke symptoms. I discussed in depth with the patient the nature of atherosclerosis, and emphasized the importance of maximal medical management including strict control of blood pressure, blood glucose, and lipid levels, obtaining regular exercise, anti-platelet use and cessation of smoking.   The patient is currently not on a statin due to reported allergy.  His cardiologist is going to consider low dose Crestor.  The patient is currently on ASA and Brillinta.   The patient is aware that without maximal medical management the underlying atherosclerotic disease process will progress, limiting the benefit of any interventions. The patient's surveillance will included routine carotid duplex studies which will be completed in: 9 months, at which time the patient will be re-evaluated.   I emphasized the importance of routine surveillance of the carotid arteries as recurrence of stenosis is possible, especially with proper management  of underlying atherosclerotic disease. The patient agrees to participate in their maximal medical care and routine surveillance. Additionally, patient recall prior episodes suggestive of intermittent claudication.  I recommended to the patient getting ABI in the next month.  He will follow up with me at that time.  Thank you for allowing us to participate in this patient's care.  Leonides SakeBrian Henry Utsey, MD, FACS [facility] [place] Office: [phone] Pager: [phone]

## 2016-11-04 ENCOUNTER — Encounter: Payer: Self-pay | Admitting: Vascular Surgery

## 2016-11-05 ENCOUNTER — Encounter: Payer: Self-pay | Admitting: Vascular Surgery

## 2016-11-05 ENCOUNTER — Ambulatory Visit (INDEPENDENT_AMBULATORY_CARE_PROVIDER_SITE_OTHER): Payer: Self-pay | Admitting: Vascular Surgery

## 2016-11-05 VITALS — BP 125/75 | HR 75 | Temp 97.3°F | Resp 20 | Ht 70.0 in | Wt 202.0 lb

## 2016-11-05 DIAGNOSIS — I779 Disorder of arteries and arterioles, unspecified: Secondary | ICD-10-CM

## 2016-11-05 DIAGNOSIS — I739 Peripheral vascular disease, unspecified: Secondary | ICD-10-CM

## 2016-11-12 NOTE — Addendum Note (Signed)
Addended by: Burton Apley A on: 11/12/2016 09:12 AM   Modules accepted: Orders

## 2016-12-06 NOTE — Progress Notes (Signed)
    Postoperative Visit   History of Present Illness   Adam Andrews is a 66 y.o. male who presents for postoperative follow-up for: right CEA for R sx carotid stenosis >80% (Date: 10/05/16).  The patient returns today for some screening ABI studies given prior history of some sx suspicious for intermittent claudication.  Additionally, note resolution of much of anesthesia in jawline and incision.  Pt notes some pain with chewing on R side   Physical Examination   Vitals:   12/10/16 0938 12/10/16 0941  BP: 119/74 126/78  Pulse: 61   Resp: 16   Temp: 97.7 F (36.5 C)   TempSrc: Oral   SpO2: 95%   Weight: 202 lb (91.6 kg)   Height:  (1.778 m)     Vascular: faintly palpable pedal pulses  M/S: 5/5 throughout   Non-invasive Vascular Imaging   ABI (12/10/2016 )  R:   ABI: 1.17,   PT: tri  DP: bi  TBI:  0.71  L:   ABI: 1.16,   PT: tri  DP: bi  TBI: 0.90   Medical Decision Making   Adam Andrews is a 65 y.o. male who presents s/p right CEA, minimal PAD with limited intermittent claudication    ABI demonsrate mild PAD in DP B but PT are completely normal.  Toe pressure also demonstrate normal pressures.  Will get B carotid duplex in 8 months  I discussed in depth with the patient the nature of atherosclerosis, and emphasized the importance of maximal medical management including strict control of blood pressure, blood glucose, and lipid levels, obtaining regular exercise, anti-platelet use and cessation of smoking.    The patient is currently not on a statin due to reported allergy.  His cardiologist is going to consider low dose Crestor.   The patient is currently on ASA and Brillinta.    The patient is aware that without maximal medical management the underlying atherosclerotic disease process will progress, limiting the benefit of any interventions.  Thank you for allowing [place] to participate in this patient's care.  Leonides Sake, MD, FACS [facility] Office: [phone] Pager: [phone]

## 2016-12-10 ENCOUNTER — Ambulatory Visit (HOSPITAL_COMMUNITY)
Admission: RE | Admit: 2016-12-10 | Discharge: 2016-12-10 | Disposition: A | Discharge status: Home / Self Care | Payer: BLUE CROSS/BLUE SHIELD | Source: Ambulatory Visit | Attending: Vascular Surgery | Admitting: Vascular Surgery

## 2016-12-10 ENCOUNTER — Encounter: Payer: Self-pay | Admitting: Vascular Surgery

## 2016-12-10 ENCOUNTER — Ambulatory Visit (INDEPENDENT_AMBULATORY_CARE_PROVIDER_SITE_OTHER): Payer: Self-pay | Admitting: Vascular Surgery

## 2016-12-10 VITALS — BP 126/78 | HR 61 | Temp 97.7°F | Resp 16 | Ht 70.0 in | Wt 202.0 lb

## 2016-12-10 DIAGNOSIS — I70213 Atherosclerosis of native arteries of extremities with intermittent claudication, bilateral legs: Secondary | ICD-10-CM

## 2016-12-10 DIAGNOSIS — I739 Peripheral vascular disease, unspecified: Secondary | ICD-10-CM | POA: Diagnosis not present

## 2016-12-14 ENCOUNTER — Telehealth: Payer: Self-pay | Admitting: Cardiology

## 2016-12-14 MED ORDER — ROSUVASTATIN CALCIUM 10 MG PO TABS: 10.0000 mg | ORAL_TABLET | Freq: Every day | ORAL | 11 refills | Status: DC

## 2016-12-14 NOTE — Telephone Encounter (Signed)
Please call,concerning his Cholesterol medicine. °

## 2016-12-14 NOTE — Telephone Encounter (Signed)
Spoke with pt, he is ready to talk about low dose crestor. He has a follow up appointment with dr Jens Somcrenshaw in November and wonders if he should start prior to appt. Will discuss with dr Jens Somcrenshaw.

## 2016-12-14 NOTE — Telephone Encounter (Signed)
Per dr Jens Somcrenshaw the patient will try rosuvastatin 10 mg once daily. He will wait for lab work closer to his follow up appointment and will have that done at his medical doctors office. New script sent to the pharmacy

## 2017-02-01 NOTE — Progress Notes (Signed)
HPI: FU CAD. Patient had PCI of his RCA in August 2010. Patient was admitted February 2018 with acute anterior infarct. He was found to have a 99% proximal LAD which was treated with a drug-eluting stent. Echocardiogram February 2018 showed injection fraction 35-40%, grade 1 diastolic dysfunction. Had CVA 8/18 followed by R CEA 8/18. CTA of neck 8/18 showed 4 mm L upper lobe pulmonary nodule; fu 12 months. Since last seen, patient denies dyspnea, chest pain, palpitations or syncope. He is having myalgias with Crestor.  Current Outpatient Medications  Medication Sig Dispense Refill  . aspirin 81 MG chewable tablet Chew 1 tablet (81 mg total) by mouth daily. (Patient taking differently: Chew 81 mg by mouth at bedtime. )    . carvedilol (COREG) 12.5 MG tablet Take 1 tablet (12.5 mg total) by mouth 2 (two) times daily with a meal. 180 tablet 3  . Cholecalciferol ([employer] VIT D 5000 HIGH-POTENCY) 5000 units capsule Take 5,000 Units by mouth at bedtime.     Marland Kitchen. ibuprofen (ADVIL,MOTRIN) 200 MG tablet Take 200 mg by mouth every 6 (six) hours as needed.    Marland Kitchen. lisinopril (PRINIVIL,ZESTRIL) 10 MG tablet Take 1 tablet (10 mg total) by mouth daily. 90 tablet 3  . Multiple Vitamin (MULTIVITAMIN WITH MINERALS) TABS Take 1 tablet by mouth daily.    . nitroGLYCERIN (NITROSTAT) 0.4 MG SL tablet Place 1 tablet (0.4 mg total) under the tongue every 5 (five) minutes as needed. For chest pain.  May repeat times three. (Patient taking differently: Place 0.4 mg under the tongue every 5 (five) minutes as needed for chest pain. For chest pain.  May repeat times three.) 25 tablet 3  . oxymetazoline (AFRIN) 0.05 % nasal spray Place 1 spray into both nostrils 2 (two) times daily as needed for congestion (sinus pressure).    . rosuvastatin (CRESTOR) 10 MG tablet Take 1 tablet (10 mg total) by mouth daily. 30 tablet 11  . tamsulosin (FLOMAX) 0.4 MG CAPS capsule Take 1 capsule (0.4 mg total) by mouth daily. (Patient taking  differently: Take 0.4 mg by mouth daily as needed (prostate pain). ) 30 capsule 0  . ticagrelor (BRILINTA) 90 MG TABS tablet Take 1 tablet (90 mg total) by mouth 2 (two) times daily. 60 tablet 11   No current facility-administered medications for this visit.      Past Medical History:  Diagnosis Date  . CAD (coronary artery disease)   . Cardiomyopathy   . Carotid stenosis, right   . Cataract    B/L  . Cerebrovascular disease   . Chronic pain    B/LLE  . Coronary artery disease   . Dyslipidemia   . Enlarged prostate   . History of cardiac cath   . Hypertension    Benign  . Hypertension   . Myocardial infarction (HCC)   . Numbness and tingling in left hand   . S/P angioplasty with stent, 04/15/16  DES to prox LAD 04/17/2016  . Stroke (HCC)   . TIA (transient ischemic attack)     Past Surgical History:  Procedure Laterality Date  . CARDIAC CATHETERIZATION    . CAROTID ENDARTERECTOMY    . CORONARY STENT INTERVENTION N/A 04/15/2016   Procedure: Coronary Stent Intervention;  Surgeon: Corky CraftsJayadeep S Varanasi, MD;  [facility];  Service: Cardiovascular;  Laterality: N/A;  . ENDARTERECTOMY Right 10/05/2016   Procedure: ENDARTERECTOMY RIGHT CAROTID ARTERY;  Surgeon: Fransisco Hertzhen, Abagayle Klutts L, MD;  Location: [facility] OR;  Service: Vascular;  Laterality: Right;  . LEFT HEART CATH AND CORONARY ANGIOGRAPHY N/A 04/15/2016   Procedure: Left Heart Cath and Coronary Angiography;  Surgeon: Corky CraftsJayadeep S Varanasi, MD;  [facility];  Service: Cardiovascular;  Laterality: N/A;  . MULTIPLE TOOTH EXTRACTIONS    . PATCH ANGIOPLASTY Right 10/05/2016   Procedure: RIGHT CAROTID ARTERY PATCH ANGIOPLASTY USING Livia SnellenXENOSURE VASCULAR PATCH;  Surgeon: Fransisco Hertzhen, Chrystie Hagwood L, MD;  [facility];  Service: Vascular;  Laterality: Right;    Social History   Socioeconomic History  . Marital status: Married    Spouse name: Not on file  . Number of children: Not on file  . Years of education: Not on file  .  Highest education level: Not on file  Social Needs  . Financial resource strain: Not on file  . Food insecurity - worry: Not on file  . Food insecurity - inability: Not on file  . Transportation needs - medical: Not on file  . Transportation needs - non-medical: Not on file  Occupational History  . Occupation: Investment banker, corporateales    Employer: [employer]  Tobacco Use  . Smoking status: Former Smoker    Types: Cigarettes  . Smokeless tobacco: Never Used  . Tobacco comment: Does not smoke.  Substance and Sexual Activity  . Alcohol use: Yes    Alcohol/week: 8.4 oz    Types: 14 Cans of beer per week    Comment: 14 cans of beer weekly  . Drug use: No  . Sexual activity: Not on file  Other Topics Concern  . Not on file  Social History Narrative   ** Merged History Encounter **       [place]    Family History  Problem Relation Age of Onset  . CAD Sister   . CAD Brother   . Stroke Brother   . Heart attack Mother   . Coronary artery disease Father   . Heart attack Sister     ROS: leg pain but no fevers or chills, productive cough, hemoptysis, dysphasia, odynophagia, melena, hematochezia, dysuria, hematuria, rash, seizure activity, orthopnea, PND, pedal edema, claudication. Remaining systems are negative.  Physical Exam: Well-developed well-nourished in no acute distress.  Skin is warm and dry.  HEENT is normal.  Neck is supple.  Chest is clear to auscultation with normal expansion.  Cardiovascular exam is regular rate and rhythm.  Abdominal exam nontender or distended. No masses palpated. Extremities show no edema. neuro grossly intact   A/P  1 coronary artery disease-patient doing well with no chest pain. Continue aspirin, Brilinta. We will discontinue Brilinta March 2019.  2 carotid artery disease-status post carotid endarterectomy-continue aspirin.  3 ischemic cardiomyopathy-continue ACE inhibitor and beta blocker.  4 hypertension-blood pressure is  controlled. Continue present medications.  5 hyperlipidemia-patient is having myalgias with Crestor. He also had myalgias with Zocor and Lipitor. I will discontinue Crestor. Refer to lipid clinic for consideration of repatha.  6 left upper lobe lung nodule-noncontrast chest CT August 2019.  Olga MillersBrian Javed Cotto, MD

## 2017-02-02 ENCOUNTER — Encounter: Payer: Self-pay | Admitting: Cardiology

## 2017-02-04 ENCOUNTER — Ambulatory Visit: Payer: BLUE CROSS/BLUE SHIELD | Admitting: Cardiology

## 2017-02-04 ENCOUNTER — Encounter: Payer: Self-pay | Admitting: Cardiology

## 2017-02-04 VITALS — BP 130/74 | HR 68 | Ht 70.0 in | Wt 204.2 lb

## 2017-02-04 DIAGNOSIS — E7800 Pure hypercholesterolemia, unspecified: Secondary | ICD-10-CM | POA: Diagnosis not present

## 2017-02-04 DIAGNOSIS — I2510 Atherosclerotic heart disease of native coronary artery without angina pectoris: Secondary | ICD-10-CM | POA: Diagnosis not present

## 2017-02-04 DIAGNOSIS — I6523 Occlusion and stenosis of bilateral carotid arteries: Secondary | ICD-10-CM | POA: Diagnosis not present

## 2017-02-04 DIAGNOSIS — I10 Essential (primary) hypertension: Secondary | ICD-10-CM | POA: Diagnosis not present

## 2017-02-04 MED ORDER — TICAGRELOR 90 MG PO TABS: 90.0000 mg | ORAL_TABLET | Freq: Two times a day (BID) | ORAL | 0 refills | Status: DC

## 2017-02-04 NOTE — Patient Instructions (Addendum)
Stop Brilinta in 90 days    Stop Crestor    Schedule appointment with Lipid Clinic appointment     Schedule non contrast chest ct in August 2019 to follow up on lung nodule    Your physician wants you to follow-up in 6 months with Dr.Crenshaw. You will receive a reminder letter in the mail two months in advance. If you don't receive a letter, please call our office to schedule the follow-up appointment.

## 2017-02-25 ENCOUNTER — Ambulatory Visit: Payer: BLUE CROSS/BLUE SHIELD | Admitting: Pharmacist

## 2017-02-25 DIAGNOSIS — E785 Hyperlipidemia, unspecified: Secondary | ICD-10-CM | POA: Diagnosis not present

## 2017-02-25 LAB — LIPID PANEL
Chol/HDL Ratio: 4.5 ratio (ref 0.0–5.0)
Cholesterol, Total: 172 mg/dL (ref 100–199)
HDL: 38 mg/dL — AB (ref >39)
LDL Calculated: 109 mg/dL — ABNORMAL HIGH (ref 0–99)
Triglycerides: 126 mg/dL (ref 0–149)
VLDL Cholesterol Cal: 25 mg/dL (ref 5–40)

## 2017-02-25 LAB — HEPATIC FUNCTION PANEL
ALBUMIN: 4.7 g/dL (ref 3.6–4.8)
ALK PHOS: 69 IU/L (ref 39–117)
ALT: 24 IU/L (ref 0–44)
AST: 19 IU/L (ref 0–40)
BILIRUBIN, DIRECT: 0.14 mg/dL (ref 0.00–0.40)
Bilirubin Total: 0.5 mg/dL (ref 0.0–1.2)
TOTAL PROTEIN: 7.3 g/dL (ref 6.0–8.5)

## 2017-02-25 NOTE — Patient Instructions (Signed)
Lipid Clinic (pharmacist) Eyva Califano/Kristin  *Will initiate paperwork for Repatha/Praluent* *Work on lifestyle modifications*   Cholesterol Cholesterol is a fat. Your body needs a small amount of cholesterol. Cholesterol (plaque) may build up in your blood vessels (arteries). That makes you more likely to have a heart attack or stroke. You cannot feel your cholesterol level. Having a blood test is the only way to find out if your level is high. Keep your test results. Work with your doctor to keep your cholesterol at a good level. What do the results mean?  Total cholesterol is how much cholesterol is in your blood.  LDL is bad cholesterol. This is the type that can build up. Try to have low LDL.  HDL is good cholesterol. It cleans your blood vessels and carries LDL away. Try to have high HDL.  Triglycerides are fat that the body can store or burn for energy. What are good levels of cholesterol?  Total cholesterol below 200.  LDL below 100 is good for people who have health risks. LDL below 70 is good for people who have very high risks.  HDL above 40 is good. It is best to have HDL of 60 or higher.  Triglycerides below 150. How can I lower my cholesterol? Diet Follow your diet program as told by your doctor.  Choose fish, white meat chicken, or [place] that is roasted or baked. Try not to eat red meat, fried foods, sausage, or lunch meats.  Eat lots of fresh fruits and vegetables.  Choose whole grains, beans, pasta, potatoes, and cereals.  Choose olive oil, corn oil, or canola oil. Only use small amounts.  Try not to eat butter, mayonnaise, shortening, or palm kernel oils.  Try not to eat foods with trans fats.  Choose low-fat or nonfat dairy foods. ? Drink skim or nonfat milk. ? Eat low-fat or nonfat yogurt and cheeses. ? Try not to drink whole milk or cream. ? Try not to eat ice cream, egg yolks, or full-fat cheeses.  Healthy desserts include angel food cake, ginger  snaps, animal crackers, hard candy, popsicles, and low-fat or nonfat frozen yogurt. Try not to eat pastries, cakes, pies, and cookies.  Exercise Follow your exercise program as told by your doctor.  Be more active. Try gardening, walking, and taking the stairs.  Ask your doctor about ways that you can be more active.  Medicine  Take over-the-counter and prescription medicines only as told by your doctor. This information is not intended to replace advice given to you by your health care provider. Make sure you discuss any questions you have with your health care provider. Document Released: 05/07/2008 Document Revised: 09/10/2015 Document Reviewed: 08/21/2015 Elsevier Interactive Patient Education  [employer]2018 Elsevier Inc.

## 2017-02-25 NOTE — Progress Notes (Signed)
Patient ID: Adam Andrews                 DOB: Mar 12, 1950                    MRN: [id number]     HPI: Adam Andrews is a 67 y.o. male patient referred to lipid clinic by Dr Jens Som. PMH is significant for CAD s/p stent placement on 04/15/16, cardiomyopathy, STEMI, TIA, carotid stenosis, hypertension, and hyperlipidemia. Noted documented history of statin intolerance due to severe muscle pains. Patient presents today for potential PCSK9 inhibitor initiation.  Current Medications: none  Intolerances:  Atorvastatin 80mg  daily(03/2016 to 07/2016) - severe muscle pain Simvastatin 40mg  daily (05/2010 to 03/2016) - unable to get up from chair Rosuvastatin 10mg  daily (11/2016 - stopped taking 02/04/2017)  - myalgias   LDL goal: 70 mg/dL  Diet: balanced diet, eats meat frequently and eggs 2-3x/week for beartfast  Family History: CAD from father,sister and brother; MI from mother and sister  Social History: former smoker, drinks ~ 14 beers per week  Labs: 02/25/2017: CHO 172; TG 126; HDL 38; LDL-c 109 09/30/2016: CHO 169; TG 171; HDL 32; LDL-c 103  Past Medical History:  Diagnosis Date  . CAD (coronary artery disease)   . Cardiomyopathy   . Carotid stenosis, right   . Cataract    B/L  . Cerebrovascular disease   . Chronic pain    B/LLE  . Coronary artery disease   . Dyslipidemia   . Enlarged prostate   . History of cardiac cath   . Hypertension    Benign  . Hypertension   . Myocardial infarction (HCC)   . Numbness and tingling in left hand   . S/P angioplasty with stent, 04/15/16  DES to prox LAD 04/17/2016  . Stroke (HCC)   . TIA (transient ischemic attack)     Current Outpatient Medications on File Prior to Visit  Medication Sig Dispense Refill  . aspirin 81 MG chewable tablet Chew 1 tablet (81 mg total) by mouth daily. (Patient taking differently: Chew 81 mg by mouth at bedtime. )    . carvedilol (COREG) 12.5 MG tablet Take 1 tablet (12.5 mg total) by mouth 2 (two) times  daily with a meal. 180 tablet 3  . Cholecalciferol (CVS VIT D 5000 HIGH-POTENCY) 5000 units capsule Take 5,000 Units by mouth at bedtime.     Marland Kitchen ibuprofen (ADVIL,MOTRIN) 200 MG tablet Take 200 mg by mouth every 6 (six) hours as needed.    Marland Kitchen lisinopril (PRINIVIL,ZESTRIL) 10 MG tablet Take 1 tablet (10 mg total) by mouth daily. 90 tablet 3  . Multiple Vitamin (MULTIVITAMIN WITH MINERALS) TABS Take 1 tablet by mouth daily.    . nitroGLYCERIN (NITROSTAT) 0.4 MG SL tablet Place 1 tablet (0.4 mg total) under the tongue every 5 (five) minutes as needed. For chest pain.  May repeat times three. (Patient taking differently: Place 0.4 mg under the tongue every 5 (five) minutes as needed for chest pain. For chest pain.  May repeat times three.) 25 tablet 3  . oxymetazoline (AFRIN) 0.05 % nasal spray Place 1 spray into both nostrils 2 (two) times daily as needed for congestion (sinus pressure).    . rosuvastatin (CRESTOR) 10 MG tablet Take 1 tablet (10 mg total) by mouth daily. 30 tablet 11  . tamsulosin (FLOMAX) 0.4 MG CAPS capsule Take 1 capsule (0.4 mg total) by mouth daily. (Patient taking differently: Take 0.4 mg by mouth daily as  needed (prostate pain). ) 30 capsule 0  . ticagrelor (BRILINTA) 90 MG TABS tablet Take 1 tablet (90 mg total) by mouth 2 (two) times daily. 180 tablet 0   No current facility-administered medications on file prior to visit.     No Known Allergies  Hyperlipidemia LDL goal <70 LDL-c remains above goal for secondary prevention. Patient previously failed trial with rosuvastatin, atorvastatin, and simvastatin due to severe muscle pain. PCSK9i is an appropriate therapeutic option for this patient. Will initiate pre-approval for Repatha/Praluent.  Medication indication, storage, administration, common side effects, and financial responsibilities were discussed during counseling. Appropriate nutritional changes and lifestyle modifications were also discussed in-depth during office  visit.    Faydra Korman Rodriguez-Guzman PharmD, BCPS, CPP [facility] [address] [place] [phone] 02/27/2017 8:19 PM

## 2017-02-27 ENCOUNTER — Encounter: Payer: Self-pay | Admitting: Pharmacist

## 2017-02-27 NOTE — Assessment & Plan Note (Addendum)
LDL-c remains above goal for secondary prevention. Patient previously failed trial with rosuvastatin, atorvastatin, and simvastatin due to severe muscle pain. PCSK9i is an appropriate therapeutic option for this patient. Will initiate pre-approval for Repatha/Praluent.  Medication indication, storage, administration, common side effects, and financial responsibilities were discussed during counseling. Appropriate nutritional changes and lifestyle modifications were also discussed in-depth during office visit.

## 2017-03-07 ENCOUNTER — Other Ambulatory Visit: Payer: Self-pay | Admitting: Pharmacist

## 2017-03-07 MED ORDER — EVOLOCUMAB 140 MG/ML ~~LOC~~ SOAJ: 140.0000 mg | SUBCUTANEOUS | 3 refills | Status: DC

## 2017-03-07 NOTE — Telephone Encounter (Signed)
Repatha 140mg  Rx sent to [facility] pharmacy. Patient will call back once 1st delivery received.

## 2017-03-25 ENCOUNTER — Telehealth: Payer: Self-pay | Admitting: Pharmacist

## 2017-03-25 DIAGNOSIS — E785 Hyperlipidemia, unspecified: Secondary | ICD-10-CM

## 2017-03-25 NOTE — Telephone Encounter (Signed)
Patient 1st Repatha 140mg  injection done under pharmacist guidance today  (right upper quadrant). No complications and no problems noted.   Will repeat fasting lipid panel and LTFs around 6th dose of Repatha.  Orders in Epic.  Camara Rosander Rodriguez-Guzman PharmD, BCPS, CPP [facility] [address] [place] [phone] 03/25/2017 2:17 PM

## 2017-05-20 ENCOUNTER — Other Ambulatory Visit: Payer: Self-pay | Admitting: Physician Assistant

## 2017-05-23 NOTE — Telephone Encounter (Signed)
Refill Request.  

## 2017-05-23 NOTE — Telephone Encounter (Signed)
Rx(s) sent to pharmacy electronically.  

## 2017-05-25 ENCOUNTER — Telehealth: Payer: Self-pay | Admitting: Cardiology

## 2017-05-25 MED ORDER — LISINOPRIL 10 MG PO TABS: 10.0000 mg | ORAL_TABLET | Freq: Every day | ORAL | 11 refills | Status: DC

## 2017-05-25 MED ORDER — CARVEDILOL 12.5 MG PO TABS: ORAL_TABLET | ORAL | 11 refills | Status: DC

## 2017-05-25 NOTE — Telephone Encounter (Signed)
Pt called in as he needed refills on his medication. When he picked up last refill bottle stated " call Dr. Jens Somrenshaw office". Pt had appt 01/2017 and has 6 mont f/u in June 2019. Pt medications refilled and reminded of June appointment.

## 2017-05-25 NOTE — Telephone Encounter (Signed)
New message    Patient was told by the pharmacy that they needed to contact Dr Jens Somrenshaw nurse, please call

## 2017-05-30 ENCOUNTER — Telehealth: Payer: Self-pay | Admitting: Pharmacist

## 2017-05-30 NOTE — Telephone Encounter (Signed)
Patient due for 6th dose of Repatha this week.  Fasting lipid blood work needed to assess therapy.   LMOM; patient to stop by clinic and repeat fasting blood work within next 2 weeks (8am to 12 noon)   Orders in Epic for lipid panel and LFTs

## 2017-06-15 LAB — HEPATIC FUNCTION PANEL
ALT: 23 IU/L (ref 0–44)
AST: 20 IU/L (ref 0–40)
Albumin: 4.4 g/dL (ref 3.6–4.8)
Alkaline Phosphatase: 62 IU/L (ref 39–117)
BILIRUBIN TOTAL: 0.3 mg/dL (ref 0.0–1.2)
Bilirubin, Direct: 0.15 mg/dL (ref 0.00–0.40)
Total Protein: 6.6 g/dL (ref 6.0–8.5)

## 2017-06-15 LAB — LIPID PANEL
CHOL/HDL RATIO: 2.1 ratio (ref 0.0–5.0)
CHOLESTEROL TOTAL: 79 mg/dL — AB (ref 100–199)
HDL: 37 mg/dL — AB (ref >39)
LDL CALC: 25 mg/dL (ref 0–99)
TRIGLYCERIDES: 87 mg/dL (ref 0–149)
VLDL CHOLESTEROL CAL: 17 mg/dL (ref 5–40)

## 2017-06-17 ENCOUNTER — Encounter: Payer: Self-pay | Admitting: *Deleted

## 2017-08-02 NOTE — Progress Notes (Signed)
Established Carotid Patient   History of Present Illness   Adam Andrews is a 67 y.o. (August 29, 1950) male who presents with chief complaint: bilateral knee pain.  Pt is s/p R CEA w/ BPA (10/05/16) for R sx ICA stenosis >80%.  Previous carotid studies demonstrated: RICA 60-79% stenosis, LICA 1-39% stenosis.  Patient has prior history of TIA: left side dyscoordination and facial and tongue anesthesia.  The patient has never had amaurosis fugax or monocular blindness.  The patient has never had facial drooping or hemiplegia.  The patient has never had receptive or expressive aphasia.  The patient's previous neurologic deficits have resolved.    Pt is taking CBD oil for his knee pain with good response.  The patient's PMH, PSH, SH, and FamHx were reviewed on 08/03/17 are unchanged from 12/10/16.  Current Outpatient Medications  Medication Sig Dispense Refill  . aspirin 81 MG chewable tablet Chew 1 tablet (81 mg total) by mouth daily. (Patient taking differently: Chew 81 mg by mouth at bedtime. )    . carvedilol (COREG) 12.5 MG tablet TAKE 1 TABLET BY MOUTH TWICE DAILY WITH A MEAL 60 tablet 11  . Cholecalciferol ([employer]S VIT D 5000 HIGH-POTENCY) 5000 units capsule Take 5,000 Units by mouth at bedtime.     . Evolocumab (REPATHA SURECLICK) 140 MG/ML SOAJ Inject 140 mg into the skin every 14 (fourteen) days. 6 pen 3  . ibuprofen (ADVIL,MOTRIN) 200 MG tablet Take 200 mg by mouth every 6 (six) hours as needed.    Marland Kitchen lisinopril (PRINIVIL,ZESTRIL) 10 MG tablet Take 1 tablet (10 mg total) by mouth daily. 30 tablet 11  . Multiple Vitamin (MULTIVITAMIN WITH MINERALS) TABS Take 1 tablet by mouth daily.    . nitroGLYCERIN (NITROSTAT) 0.4 MG SL tablet Place 1 tablet (0.4 mg total) under the tongue every 5 (five) minutes as needed. For chest pain.  May repeat times three. (Patient taking differently: Place 0.4 mg under the tongue every 5 (five) minutes as needed for chest pain. For chest pain.  May repeat times  three.) 25 tablet 3  . oxymetazoline (AFRIN) 0.05 % nasal spray Place 1 spray into both nostrils 2 (two) times daily as needed for congestion (sinus pressure).    . tamsulosin (FLOMAX) 0.4 MG CAPS capsule Take 1 capsule (0.4 mg total) by mouth daily. (Patient taking differently: Take 0.4 mg by mouth daily as needed (prostate pain). ) 30 capsule 0  . ticagrelor (BRILINTA) 90 MG TABS tablet Take 1 tablet (90 mg total) by mouth 2 (two) times daily. 180 tablet 0   No current facility-administered medications for this visit.     On ROS today: +B knee pain, no TIA or CVA sx   Physical Examination   Vitals:   08/03/17 0945  BP: 140/78  Pulse: 96  Weight: 207 lb (93.9 kg)  Height: 5\' 10"  (1.778 m)   Body mass index is 29.7 kg/m.  General Alert, O x 3, WD, NAD  Neck Supple, mid-line trachea,    Pulmonary Sym exp, good B air movt, CTA B  Cardiac RRR, Nl S1, S2, no Murmurs, No rubs, No S3,S4  Vascular Vessel Right Left  Radial Palpable Palpable  Brachial Palpable Palpable  Carotid Palpable, No Bruit Palpable, No Bruit  Aorta Not palpable N/A  Femoral Palpable Palpable  Popliteal Not palpable Not palpable  PT Palpable Palpable  DP Palpable Palpable    Gastro- intestinal soft, non-distended, non-tender to palpation, No guarding or rebound, no HSM, no masses,  no CVAT B, No palpable prominent aortic pulse,    Musculo- skeletal M/S 5/5 throughout  , Extremities without ischemic changes  , B 1+ edema, spider and small varicose veins  Neurologic Cranial nerves 2-12 intact , Pain and light touch intact in extremities , Motor exam as listed above    Non-Invasive Vascular Imaging   B Carotid Duplex (08/03/2017):   R ICA stenosis:  widely patent CEA  R VA: patent and antegrade  L ICA stenosis:  1-39%  L VA: patent and antegrade   Medical Decision Making   [name] is a 67 y.o. male who presents with: R CVA, possible sx RICA stenosis >70%, s/p recent PCI for recent MI,  reported statin intolerance vs allergy, BLE CVI (C3)   Based on the patient's vascular studies and examination, I have offered the patient: annual B carotid duplex.  I discussed in depth with the patient the nature of atherosclerosis, and emphasized the importance of maximal medical management including strict control of blood pressure, blood glucose, and lipid levels, antiplatelet agents, obtaining regular exercise, and cessation of smoking.    The patient is aware that without maximal medical management the underlying atherosclerotic disease process will progress, limiting the benefit of any interventions.  The patient is currently on Repatha due to statin intolerance vs allergy.   The patient is currently Brillinta and ASA for his PCI.  Thank you for allowing us to participate in this patient's care.   [name], MD, FACS [facility] Office: [phone] Pager: [phone]   VASCULAR QUALITY INITIATIVE FOLLOW UP DATA:  Current smoker: [  ] yes  [ x ] no  Living status: [x  ]  Home  [  ] Nursing home  [  ] Homeless    MEDS:  ASA [  x] yes  [  ] no- [  ] medical reason  [  ] non compliant  STATIN  [  ] yes  [ x ] no- [x  ] medical reason  [  ] non compliant  Beta blocker [ x ] yes  [  ] no- [  ] medical reason  [  ] non compliant  ACE inhibitor [ x ] yes  [  ] no- [  ] medical reason  [  ] non compliant  P2Y12 Antagonist [ x ] none  [  ] clopidogrel-Plavix  [  ] ticlopidine-Ticlid   [  ] prasugrel-Effient  [  ] ticagrelor- Brilinta    Anticoagulant [x  ] None  [  ] warfarin  [  ] rivaroxaban-Xarelto [  ] dabigatran- Pradaxa  Neurologic event since D/C:  [  x] no  [  ] yes: [  ] eye event  [  ] cortical event  [  ] VB event  [  ] non specific event  [  ] right  [  ] left  [  ] TIA  [  ] stroke  Date:   Modified Rankin Score: 0  MI since D/C: [ x ] no  [  ] troponin only  [  ] EKG or clinical  Cranial nerve injury: [ x ] none  [  ]  resolved  [  ] persistent  Duplex CEA site: [  ] no  [x  ] yes - PSV= 72  EDV= 28  ICA/CCA ratio: 0.7  Stenosis= [x  ] <40% [  ] 40-59% [  ] 60-79%  [  ] >  80%  [  ]  Occluded  CEA site re-operation:  [ x ] no   [  ] yes- date of re-op:  CEA site PCI:   [ x ] no   [  ] yes- date of PCI:

## 2017-08-03 ENCOUNTER — Encounter: Payer: Self-pay | Admitting: Vascular Surgery

## 2017-08-03 ENCOUNTER — Ambulatory Visit (HOSPITAL_COMMUNITY)
Admission: RE | Admit: 2017-08-03 | Discharge: 2017-08-03 | Disposition: A | Discharge status: Home / Self Care | Payer: BLUE CROSS/BLUE SHIELD | Source: Ambulatory Visit | Attending: Vascular Surgery | Admitting: Vascular Surgery

## 2017-08-03 ENCOUNTER — Ambulatory Visit: Payer: BLUE CROSS/BLUE SHIELD | Admitting: Vascular Surgery

## 2017-08-03 VITALS — BP 140/78 | HR 96 | Ht 70.0 in | Wt 207.0 lb

## 2017-08-03 DIAGNOSIS — I2510 Atherosclerotic heart disease of native coronary artery without angina pectoris: Secondary | ICD-10-CM | POA: Insufficient documentation

## 2017-08-03 DIAGNOSIS — I6523 Occlusion and stenosis of bilateral carotid arteries: Secondary | ICD-10-CM | POA: Diagnosis not present

## 2017-08-03 DIAGNOSIS — I779 Disorder of arteries and arterioles, unspecified: Secondary | ICD-10-CM | POA: Insufficient documentation

## 2017-08-03 DIAGNOSIS — E785 Hyperlipidemia, unspecified: Secondary | ICD-10-CM | POA: Diagnosis not present

## 2017-08-03 DIAGNOSIS — Z87891 Personal history of nicotine dependence: Secondary | ICD-10-CM | POA: Diagnosis not present

## 2017-08-03 DIAGNOSIS — I10 Essential (primary) hypertension: Secondary | ICD-10-CM | POA: Diagnosis not present

## 2017-08-03 DIAGNOSIS — I739 Peripheral vascular disease, unspecified: Secondary | ICD-10-CM

## 2017-08-05 ENCOUNTER — Ambulatory Visit: Payer: BLUE CROSS/BLUE SHIELD | Admitting: Vascular Surgery

## 2017-08-05 ENCOUNTER — Encounter (HOSPITAL_COMMUNITY): Payer: BLUE CROSS/BLUE SHIELD

## 2017-08-07 NOTE — Progress Notes (Signed)
Cardiology Office Note   Date:  08/08/2017   ID:  ZEDRICK SPRINGSTEEN, DOB 1950/04/15, MRN [id number]  PCP:  Salli Real, MD  Cardiologist:  Dr. Jens Som   Chief Complaint  Patient presents with  . Coronary Artery Disease  . Hypertension  . Hyperlipidemia     History of Present Illness: Adam Andrews is a 67 y.o. male who presents for ongoing assessment and management of coronary artery disease, patient has a history of PCI to the RCA in August 2010, 99% proximal LAD lesion in February 2018 treated with a drug-eluting stent.  Other history includes ischemic cardiomyopathy, carotid artery disease with history of CVA, followed by right carotid endarterectomy in August 2018, followed by Dr. Imogene Burn, hypertension, history of chronic pain syndrome.    He was last seen in the office by Dr. Jens Som on 02/04/2017.  At that time the patient stable from a cardiac standpoint, however he was complaining of myalgia pain on statin therapy.  He was referred to the lipid clinic for consideration of Repatha.The patient is followed by our pharmacy team and is now receiving Repatha injections.   He comes today without cardiac complaints but is having chronic leg pain and arthritis pain in his knees. He has started taking CBD oil 350 mg BID at the suggestion of a friend. He states over the last 10 days he is seeing significant improvement in arthritis pain but continues to have leg cramping especially when he is lying down, not with walking.   Past Medical History:  Diagnosis Date  . CAD (coronary artery disease)   . Cardiomyopathy   . Carotid stenosis, right   . Cataract    B/L  . Cerebrovascular disease   . Chronic pain    B/LLE  . Coronary artery disease   . Dyslipidemia   . Enlarged prostate   . History of cardiac cath   . Hypertension    Benign  . Hypertension   . Myocardial infarction (HCC)   . Numbness and tingling in left hand   . S/P angioplasty with stent, 04/15/16  DES to prox LAD  04/17/2016  . Stroke (HCC)   . TIA (transient ischemic attack)     Past Surgical History:  Procedure Laterality Date  . CARDIAC CATHETERIZATION    . CAROTID ENDARTERECTOMY    . CORONARY STENT INTERVENTION N/A 04/15/2016   Procedure: Coronary Stent Intervention;  Surgeon: Corky Crafts, MD;  Location[facility] INVASIVE CV LAB;  Service: Cardiovascular;  Laterality: N/A;  . ENDARTERECTOMY Right 10/05/2016   Procedure: ENDARTERECTOMY RIGHT CAROTID ARTERY;  Surgeon: [name], MD;  Location: [facility] OR;  Service: Vascular;  Laterality: Right;  . LEFT HEART CATH AND CORONARY ANGIOGRAPHY N/A 04/15/2016   Procedure: Left Heart Cath and Coronary Angiography;  Surgeon: Corky Crafts, MD;  Location: [facility] INVASIVE CV LAB;  Service: Cardiovascular;  Laterality: N/A;  . MULTIPLE TOOTH EXTRACTIONS    . PATCH ANGIOPLASTY Right 10/05/2016   Procedure: RIGHT CAROTID ARTERY PATCH ANGIOPLASTY USING Livia Snellen VASCULAR PATCH;  Surgeon: [name], MD;  Location: [facility] OR;  Service: Vascular;  Laterality: Right;     Current Outpatient Medications  Medication Sig Dispense Refill  . aspirin 81 MG chewable tablet Chew 1 tablet (81 mg total) by mouth daily. (Patient taking differently: Chew 81 mg by mouth at bedtime. )    . carvedilol (COREG) 12.5 MG tablet TAKE 1 TABLET BY MOUTH TWICE DAILY WITH A MEAL 60 tablet 11  . Cholecalciferol (CVS  VIT D 5000 HIGH-POTENCY) 5000 units capsule Take 5,000 Units by mouth at bedtime.     . Evolocumab (REPATHA SURECLICK) 140 MG/ML SOAJ Inject 140 mg into the skin every 14 (fourteen) days. 6 pen 3  . ibuprofen (ADVIL,MOTRIN) 200 MG tablet Take 200 mg by mouth every 6 (six) hours as needed.    Marland Kitchen. lisinopril (PRINIVIL,ZESTRIL) 10 MG tablet Take 1 tablet (10 mg total) by mouth daily. 30 tablet 11  . Multiple Vitamin (MULTIVITAMIN WITH MINERALS) TABS Take 1 tablet by mouth daily.    . nitroGLYCERIN (NITROSTAT) 0.4 MG SL tablet Place 1 tablet (0.4 mg total) under the tongue every 5  (five) minutes as needed. For chest pain.  May repeat times three. (Patient taking differently: Place 0.4 mg under the tongue every 5 (five) minutes as needed for chest pain. For chest pain.  May repeat times three.) 25 tablet 3  . oxymetazoline (AFRIN) 0.05 % nasal spray Place 1 spray into both nostrils 2 (two) times daily as needed for congestion (sinus pressure).    . tamsulosin (FLOMAX) 0.4 MG CAPS capsule Take 1 capsule (0.4 mg total) by mouth daily. (Patient taking differently: Take 0.4 mg by mouth daily as needed (prostate pain). ) 30 capsule 0   No current facility-administered medications for this visit.     Allergies:   Patient has no known allergies.    Social History:  The patient  reports that he has quit smoking. His smoking use included cigarettes. He has never used smokeless tobacco. He reports that he drinks about 8.4 oz of alcohol per week. He reports that he does not use drugs.   Family History:  The patient's family history includes CAD in his brother and sister; Coronary artery disease in his father; Heart attack in his mother and sister; Stroke in his brother.    ROS: All other systems are reviewed and negative. Unless otherwise mentioned in H&P    PHYSICAL EXAM: VS:  BP 116/62   Pulse 73   Ht 5\' 10"  (1.778 m)   Wt 209 lb (94.8 kg)   BMI 29.99 kg/m  , BMI Body mass index is 29.99 kg/m. GEN: Well nourished, well developed, in no acute distress  HEENT: normal  Neck: no JVD, carotid bruits, or masses Cardiac: RRR; no murmurs, rubs, or gallops,no edema  Respiratory:  clear to auscultation bilaterally, normal work of breathing GI: soft, nontender, nondistended, + BS MS: no deformity or atrophy  Skin: warm and dry, no rash Neuro:  Strength and sensation are intact Psych: euthymic mood, full affect   EKG:  NSR rate of 73 bpm   Recent Labs: 10/06/2016: BUN 10; Creatinine, Ser 0.89; Hemoglobin 11.4; Platelets 195; Potassium 4.0; Sodium 138 06/15/2017: ALT 23     Lipid Panel    Component Value Date/Time   CHOL 79 (L) 06/15/2017 0830   TRIG 87 06/15/2017 0830   HDL 37 (L) 06/15/2017 0830   CHOLHDL 2.1 06/15/2017 0830   CHOLHDL 5.3 09/30/2016 0431   VLDL 34 09/30/2016 0431   LDLCALC 25 06/15/2017 0830      Wt Readings from Last 3 Encounters:  08/08/17 209 lb (94.8 kg)  08/03/17 207 lb (93.9 kg)  02/04/17 204 lb 3.2 oz (92.6 kg)      Other studies Reviewed: Echocardiogram 09/30/2016 Left ventricle: The cavity size was normal. There was mild   concentric hypertrophy. Systolic function was normal. The   estimated ejection fraction was in the range of 55% to 60%. Wall  motion was normal; there were no regional wall motion   abnormalities. Doppler parameters are consistent with abnormal   left ventricular relaxation (grade 1 diastolic dysfunction).   Doppler parameters are consistent with elevated ventricular   end-diastolic filling pressure. - Aortic valve: There was no regurgitation. - Aortic root: The aortic root was normal in size. - Mitral valve: There was no regurgitation. - Left atrium: The atrium was mildly dilated. - Right ventricle: Systolic function was normal. - Tricuspid valve: There was no regurgitation. - Pulmonary arteries: Systolic pressure was within the normal   range. - Inferior vena cava: The vessel was normal in size. - Pericardium, extracardiac: There was no pericardial effusion.  ASSESSMENT AND PLAN:  1.  Coronary artery disease: He is doing very well without any complaints of chest pain dyspnea or fatigue.  He is medically compliant.  No plan cardiac testing or changes in medication at this time.  2.  History of CVA: Status post right carotid endarterectomy August 2018 and followed by Dr. Imogene Burn.  Continue current medication regimen and secondary prevention.  3.  Hypercholesterolemia: Excellent control on Repatha.  Patient's LDL decreased from 109 to 25, total cholesterol from 172 to 79, and triglycerides  decreased from 126-87.  Continue Repatha.  4.  Chronic leg cramps: Usually occurring at night when supine.  Usually massage of the legs helps.  The patient has had ABIs completed 1 year ago with no evidence of occlusive arterial disease per Dr. Imogene Burn.  It is our recommendation that he wear support hose to assist with venous return.  I am going to check a BMET and a magnesium to evaluate for deficiencies causing discomfort.  Current medicines are reviewed at length with the patient today.    Labs/ tests ordered today include: BMET, magnesium  Bettey Mare. Liborio Nixon, ANP, AACC   08/08/2017 8:34 AM    [facility] [address] [place], [place] [address] Phone: [phone]; Fax: [phone]

## 2017-08-08 ENCOUNTER — Encounter: Payer: Self-pay | Admitting: Adult Health

## 2017-08-08 ENCOUNTER — Ambulatory Visit: Payer: BLUE CROSS/BLUE SHIELD | Admitting: Adult Health

## 2017-08-08 VITALS — BP 116/62 | HR 73 | Ht 70.0 in | Wt 209.0 lb

## 2017-08-08 DIAGNOSIS — M79604 Pain in right leg: Secondary | ICD-10-CM | POA: Diagnosis not present

## 2017-08-08 DIAGNOSIS — M79605 Pain in left leg: Secondary | ICD-10-CM | POA: Diagnosis not present

## 2017-08-08 DIAGNOSIS — I6523 Occlusion and stenosis of bilateral carotid arteries: Secondary | ICD-10-CM

## 2017-08-08 DIAGNOSIS — Z79899 Other long term (current) drug therapy: Secondary | ICD-10-CM | POA: Diagnosis not present

## 2017-08-08 DIAGNOSIS — E7800 Pure hypercholesterolemia, unspecified: Secondary | ICD-10-CM | POA: Diagnosis not present

## 2017-08-08 DIAGNOSIS — I2510 Atherosclerotic heart disease of native coronary artery without angina pectoris: Secondary | ICD-10-CM | POA: Diagnosis not present

## 2017-08-08 LAB — MAGNESIUM: MAGNESIUM: 2.1 mg/dL (ref 1.6–2.3)

## 2017-08-08 LAB — BASIC METABOLIC PANEL
BUN/Creatinine Ratio: 12 (ref 10–24)
BUN: 10 mg/dL (ref 8–27)
CALCIUM: 9.7 mg/dL (ref 8.6–10.2)
CHLORIDE: 103 mmol/L (ref 96–106)
CO2: 24 mmol/L (ref 20–29)
Creatinine, Ser: 0.81 mg/dL (ref 0.76–1.27)
GFR calc non Af Amer: 94 mL/min/{1.73_m2} (ref >59)
GFR, EST AFRICAN AMERICAN: 109 mL/min/{1.73_m2} (ref >59)
GLUCOSE: 116 mg/dL — AB (ref 65–99)
Potassium: 4.6 mmol/L (ref 3.5–5.2)
Sodium: 142 mmol/L (ref 134–144)

## 2017-08-08 NOTE — Patient Instructions (Signed)
Medication Instructions:  NO CHANGES- Your physician recommends that you continue on your current medications as directed. Please refer to the Current Medication list given to you today.  If you need a refill on your cardiac medications before your next appointment, please call your pharmacy.  Labwork: BMET AND MAG TODAY HERE IN OUR OFFICE AT [facility]  Take the provided lab slips with you to the lab for your blood draw.   Special Instructions: WEAR COMPRESSION STOCKINGS, ON DURING THE DAY AND OFF AT BEDTIME  Follow-Up: Your physician wants you to follow-up in: 6 MONTHS WITH DR Jens SomRENSHAW You should receive a reminder letter in the mail two months in advance. If you do not receive a letter, please call our office 10 2019 to schedule the 12 2019 follow-up appointment.   Thank you for choosing [facility]!!

## 2017-09-06 ENCOUNTER — Other Ambulatory Visit: Payer: Self-pay | Admitting: *Deleted

## 2017-09-15 ENCOUNTER — Telehealth: Payer: Self-pay | Admitting: *Deleted

## 2017-09-15 DIAGNOSIS — R918 Other nonspecific abnormal finding of lung field: Secondary | ICD-10-CM

## 2017-09-15 NOTE — Telephone Encounter (Signed)
Left message for patient to call, he is due a CT wo to follow up a lung nodule. The scan is due after 10-01-17.

## 2017-09-29 NOTE — Telephone Encounter (Signed)
Spoke with pt, CT scheduled at the [address] location 10-14-17 @ 10 am.

## 2017-10-14 ENCOUNTER — Other Ambulatory Visit: Payer: BLUE CROSS/BLUE SHIELD

## 2017-12-23 ENCOUNTER — Telehealth: Payer: Self-pay | Admitting: Pharmacist Clinician (PhC)/ Clinical Pharmacy Specialist

## 2017-12-23 DIAGNOSIS — E7800 Pure hypercholesterolemia, unspecified: Secondary | ICD-10-CM

## 2017-12-23 NOTE — Telephone Encounter (Signed)
Spoke with patient.  Patient on Medicare as of 12/23/17.  He will come in on Monday to get lipid labs drawn and leave copy of new Supplement with Part D card.  Pt aware labs need to be fasting.

## 2017-12-27 LAB — LIPID PANEL
CHOL/HDL RATIO: 2.1 ratio (ref 0.0–5.0)
Cholesterol, Total: 90 mg/dL — ABNORMAL LOW (ref 100–199)
HDL: 43 mg/dL (ref >39)
LDL CALC: 25 mg/dL (ref 0–99)
Triglycerides: 108 mg/dL (ref 0–149)
VLDL Cholesterol Cal: 22 mg/dL (ref 5–40)

## 2017-12-28 ENCOUNTER — Other Ambulatory Visit: Payer: Self-pay | Admitting: Pharmacist

## 2017-12-28 MED ORDER — EVOLOCUMAB 140 MG/ML ~~LOC~~ SOAJ: 140.0000 mg | SUBCUTANEOUS | 11 refills | Status: DC

## 2018-01-13 ENCOUNTER — Telehealth: Payer: Self-pay | Admitting: Pharmacist

## 2018-01-13 NOTE — Telephone Encounter (Signed)
[employer] foundation approved until 02/22/2019

## 2018-01-25 ENCOUNTER — Telehealth: Payer: Self-pay

## 2018-01-25 NOTE — Telephone Encounter (Signed)
Pt called back at 820 am left message stating that [facility] is looking for PA and pt just wanted to keep the office updated

## 2018-01-25 NOTE — Telephone Encounter (Signed)
Pt called left message stated that they are working on getting approved from the manufacturer for Repatha but that they might need to come by the lipid clinic to pick up another sample dose

## 2018-01-25 NOTE — Telephone Encounter (Signed)
Patient is  to call [employer] safety net to place 1st delivery order.   Instructions and phone number provided (again).

## 2018-02-01 ENCOUNTER — Telehealth: Payer: Self-pay | Admitting: Cardiology

## 2018-02-01 NOTE — Telephone Encounter (Signed)
  [facility] is calling to check on the authorization for Evolocumab (REPATHA SURECLICK) 140 MG/ML SOAJ

## 2018-02-02 NOTE — Telephone Encounter (Signed)
Prior authorization for Repatha was approved on 01/09/2018.  [facility] (approved until Dec/31/2020).   Talked to [facility] -  Rx cancelled.

## 2018-02-02 NOTE — Telephone Encounter (Signed)
Follow up   Pt c/o medication issue:  1. Name of Medication:   Evolocumab (REPATHA SURECLICK) 140 MG/ML SOAJ     2. How are you currently taking this medication (dosage and times per day)? n/a  3. Are you having a reaction (difficulty breathing--STAT)n/a  4. What is your medication issue? Per [facility], has sent a prior authorization on 01/30/2018. Wants to know if the prior authorization is being worked on? Please advise.

## 2018-02-13 ENCOUNTER — Other Ambulatory Visit: Payer: Self-pay | Admitting: *Deleted

## 2018-02-13 MED ORDER — CARVEDILOL 12.5 MG PO TABS: ORAL_TABLET | ORAL | 1 refills | Status: DC

## 2018-02-13 MED ORDER — LISINOPRIL 10 MG PO TABS: 10.0000 mg | ORAL_TABLET | Freq: Every day | ORAL | 1 refills | Status: DC

## 2018-07-27 ENCOUNTER — Other Ambulatory Visit: Payer: Self-pay | Admitting: Cardiology

## 2018-09-19 ENCOUNTER — Other Ambulatory Visit: Payer: Self-pay | Admitting: Cardiology

## 2018-11-15 ENCOUNTER — Other Ambulatory Visit: Payer: Self-pay | Admitting: Cardiology

## 2018-12-12 ENCOUNTER — Other Ambulatory Visit: Payer: Self-pay | Admitting: Cardiology

## 2018-12-18 ENCOUNTER — Other Ambulatory Visit: Payer: Self-pay

## 2018-12-18 DIAGNOSIS — G459 Transient cerebral ischemic attack, unspecified: Secondary | ICD-10-CM

## 2018-12-18 DIAGNOSIS — E785 Hyperlipidemia, unspecified: Secondary | ICD-10-CM

## 2018-12-18 NOTE — Progress Notes (Signed)
Released labs to mail to the pt

## 2018-12-25 ENCOUNTER — Telehealth: Payer: Self-pay

## 2018-12-25 NOTE — Telephone Encounter (Signed)
Pt called stated they were out of the repatha and need a sample to get them for now while they work on getting the snf paperwork. I told the pt that yes we can give them 1 sample and also instructed the pt to come in and get the labs done for lipids as well

## 2018-12-26 LAB — COMPREHENSIVE METABOLIC PANEL
ALT: 20 IU/L (ref 0–44)
AST: 16 IU/L (ref 0–40)
Albumin/Globulin Ratio: 2.1 (ref 1.2–2.2)
Albumin: 4.6 g/dL (ref 3.8–4.8)
Alkaline Phosphatase: 60 IU/L (ref 39–117)
BUN/Creatinine Ratio: 14 (ref 10–24)
BUN: 11 mg/dL (ref 8–27)
Bilirubin Total: 0.4 mg/dL (ref 0.0–1.2)
CO2: 24 mmol/L (ref 20–29)
Calcium: 9.1 mg/dL (ref 8.6–10.2)
Chloride: 103 mmol/L (ref 96–106)
Creatinine, Ser: 0.79 mg/dL (ref 0.76–1.27)
GFR calc Af Amer: 109 mL/min/{1.73_m2} (ref >59)
GFR calc non Af Amer: 94 mL/min/{1.73_m2} (ref >59)
Globulin, Total: 2.2 g/dL (ref 1.5–4.5)
Glucose: 117 mg/dL — ABNORMAL HIGH (ref 65–99)
Potassium: 4.2 mmol/L (ref 3.5–5.2)
Sodium: 141 mmol/L (ref 134–144)
Total Protein: 6.8 g/dL (ref 6.0–8.5)

## 2018-12-26 LAB — HEPATIC FUNCTION PANEL
ALT: 17 IU/L (ref 0–44)
AST: 15 IU/L (ref 0–40)
Albumin: 4.6 g/dL (ref 3.8–4.8)
Alkaline Phosphatase: 63 IU/L (ref 39–117)
Bilirubin Total: 0.4 mg/dL (ref 0.0–1.2)
Bilirubin, Direct: 0.14 mg/dL (ref 0.00–0.40)
Total Protein: 7.1 g/dL (ref 6.0–8.5)

## 2018-12-26 LAB — LIPID PANEL
Chol/HDL Ratio: 2.2 ratio (ref 0.0–5.0)
Cholesterol, Total: 91 mg/dL — ABNORMAL LOW (ref 100–199)
HDL: 42 mg/dL (ref >39)
LDL Chol Calc (NIH): 34 mg/dL (ref 0–99)
Triglycerides: 66 mg/dL (ref 0–149)
VLDL Cholesterol Cal: 15 mg/dL (ref 5–40)

## 2019-01-23 ENCOUNTER — Other Ambulatory Visit: Payer: Self-pay | Admitting: Cardiology

## 2019-01-24 ENCOUNTER — Other Ambulatory Visit: Payer: Self-pay

## 2019-01-24 MED ORDER — REPATHA SURECLICK 140 MG/ML ~~LOC~~ SOAJ: 140.0000 mg | SUBCUTANEOUS | 3 refills | Status: DC

## 2019-03-05 ENCOUNTER — Other Ambulatory Visit: Payer: Self-pay | Admitting: Cardiology

## 2019-03-05 MED ORDER — CARVEDILOL 12.5 MG PO TABS: 12.5000 mg | ORAL_TABLET | Freq: Two times a day (BID) | ORAL | 0 refills | Status: DC

## 2019-03-05 MED ORDER — LISINOPRIL 10 MG PO TABS: 10.0000 mg | ORAL_TABLET | Freq: Every day | ORAL | 0 refills | Status: DC

## 2019-03-05 NOTE — Telephone Encounter (Signed)
New Message     *STAT* If patient is at the pharmacy, call can be transferred to refill team.   1. Which medications need to be refilled? (please list name of each medication and dose if known)  lisinopril (ZESTRIL) 10 MG tablet carvedilol (COREG) 12.5 MG tablet  2. Which pharmacy/location (including street and city if local pharmacy) is medication to be sent to? [facility] - [place], [place] - [address]  3. Do they need a 30 day or 90 day supply?  90 Day supply       Pt needs a 90 day supply on all of his medications so he will not be charged

## 2019-03-05 NOTE — Telephone Encounter (Signed)
Rx(s) sent to pharmacy electronically. Patient is overdue for appointment

## 2019-03-08 ENCOUNTER — Telehealth: Payer: Self-pay

## 2019-03-08 NOTE — Telephone Encounter (Signed)
Called the pharmacy to see why the pt was only getting a 1 month supply when I wrote rx for repatha for 90 day. They stated that they will flag it for the next time that he is due for a fill because he just had it filled yesterday ad if they tried to check it would populate a msg that states refill requested to soon

## 2019-03-16 ENCOUNTER — Encounter: Payer: Self-pay | Admitting: Cardiology

## 2019-03-16 MED ORDER — LISINOPRIL 10 MG PO TABS: 10.0000 mg | ORAL_TABLET | Freq: Every day | ORAL | 0 refills | Status: DC

## 2019-03-16 NOTE — Telephone Encounter (Signed)
error 

## 2019-03-16 NOTE — Telephone Encounter (Signed)
Patient is calling to follow up in regards to lisinopril (ZESTRIL) 10 MG tablet prescription. Patient states he has not yet received medication in mail from [place], however, he has been completely out of medication for 2 days. Patient states he is concerned because he does not know how long medication will take to arrive and he has no way of tracking medication. Patient is requesting to have lisinopril (ZESTRIL) 10 MG tablet medication prescription sent to [facility] [address], [place], [place] [address]. Please call to assist.

## 2019-03-16 NOTE — Addendum Note (Signed)
Addended by: Raelyn Number on: 03/16/2019 01:49 PM   Modules accepted: Orders

## 2019-03-20 NOTE — Progress Notes (Signed)
HPI: FU CAD.Patient had PCI of his RCAin August 2010.Patient was admitted February 2018 with acute anterior infarct. He was found to have a 99% proximal LAD which was treated with a drug-eluting stent.Echocardiogram February 2018 showed injection fraction 60-10%, grade 1 diastolic dysfunction.Had CVA 8/18 followed by R CEA 8/18. CTA of neck 8/18 showed 4 mm L upper lobe pulmonary nodule; fu 12 months.  Carotid Dopplers June 2019 showed patent right carotid endarterectomy site and 1 to 39% on the left.  Since last seen,he has dyspnea with vigorous activities but not routine activities.  No chest pain, palpitations or syncope.  Current Outpatient Medications  Medication Sig Dispense Refill  . aspirin 81 MG chewable tablet Chew 1 tablet (81 mg total) by mouth daily. (Patient taking differently: Chew 81 mg by mouth at bedtime. )    . carvedilol (COREG) 12.5 MG tablet Take 1 tablet (12.5 mg total) by mouth 2 (two) times daily. <PLEASE MAKE APPOINTMENT FOR REFILLS> 30 tablet 0  . Cholecalciferol ([employer] VIT D 5000 HIGH-POTENCY) 5000 units capsule Take 5,000 Units by mouth at bedtime.     . Evolocumab (REPATHA SURECLICK) 932 MG/ML SOAJ Inject 140 mg into the skin every 14 (fourteen) days. 6 pen 3  . ibuprofen (ADVIL,MOTRIN) 200 MG tablet Take 200 mg by mouth every 6 (six) hours as needed.    Marland Kitchen lisinopril (ZESTRIL) 10 MG tablet Take 1 tablet (10 mg total) by mouth daily. KEEP OV. 30 tablet 0  . Multiple Vitamin (MULTIVITAMIN WITH MINERALS) TABS Take 1 tablet by mouth daily.    . nitroGLYCERIN (NITROSTAT) 0.4 MG SL tablet Place 1 tablet (0.4 mg total) under the tongue every 5 (five) minutes as needed. For chest pain.  May repeat times three. (Patient taking differently: Place 0.4 mg under the tongue every 5 (five) minutes as needed for chest pain. For chest pain.  May repeat times three.) 25 tablet 3  . tamsulosin (FLOMAX) 0.4 MG CAPS capsule Take 1 capsule (0.4 mg total) by mouth daily. (Patient  taking differently: Take 0.4 mg by mouth daily as needed (prostate pain). ) 30 capsule 0  . oxymetazoline (AFRIN) 0.05 % nasal spray Place 1 spray into both nostrils 2 (two) times daily as needed for congestion (sinus pressure).     No current facility-administered medications for this visit.     Past Medical History:  Diagnosis Date  . CAD (coronary artery disease)   . Cardiomyopathy   . Carotid stenosis, right   . Cataract    B/L  . Cerebrovascular disease   . Chronic pain    B/LLE  . Coronary artery disease   . Dyslipidemia   . Enlarged prostate   . History of cardiac cath   . Hypertension    Benign  . Hypertension   . Myocardial infarction ([place])   . Numbness and tingling in left hand   . S/P angioplasty with stent, 04/15/16  DES to prox LAD 04/17/2016  . Stroke ([place])   . TIA (transient ischemic attack)     Past Surgical History:  Procedure Laterality Date  . CARDIAC CATHETERIZATION    . CAROTID ENDARTERECTOMY    . CORONARY STENT INTERVENTION N/A 04/15/2016   Procedure: Coronary Stent Intervention;  Surgeon: Jettie Booze, MD;  Location: [place] [facility];  Service: Cardiovascular;  Laterality: N/A;  . ENDARTERECTOMY Right 10/05/2016   Procedure: ENDARTERECTOMY RIGHT CAROTID ARTERY;  Surgeon: Conrad Hillburn, MD;  Location: [place];  Service: Vascular;  Laterality: Right;  . LEFT HEART CATH AND CORONARY ANGIOGRAPHY N/A 04/15/2016   Procedure: Left Heart Cath and Coronary Angiography;  Surgeon: Corky Crafts, MD;  Location: [facility] INVASIVE CV LAB;  Service: Cardiovascular;  Laterality: N/A;  . MULTIPLE TOOTH EXTRACTIONS    . PATCH ANGIOPLASTY Right 10/05/2016   Procedure: RIGHT CAROTID ARTERY PATCH ANGIOPLASTY USING Livia Snellen VASCULAR PATCH;  Surgeon: [name], MD;  Location: [facility] OR;  Service: Vascular;  Laterality: Right;    Social History   Socioeconomic History  . Marital status: Married    Spouse name: Not on file  . Number of children: Not on file  . Years  of education: Not on file  . Highest education level: Not on file  Occupational History  . Occupation: Investment banker, corporate: [employer]  Tobacco Use  . Smoking status: Former Smoker    Types: Cigarettes  . Smokeless tobacco: Never Used  . Tobacco comment: Does not smoke.  Substance and Sexual Activity  . Alcohol use: Yes    Alcohol/week: 14.0 standard drinks    Types: 14 Cans of beer per week    Comment: 14 cans of beer weekly  . Drug use: No  . Sexual activity: Not on file  Other Topics Concern  . Not on file  Social History Narrative   ** Merged History Encounter **       Lives in [place], [place].   Social Determinants of Health   Financial Resource Strain:   . Difficulty of Paying Living Expenses: Not on file  Food Insecurity:   . Worried About Programme researcher, broadcasting/film/video in the Last Year: Not on file  . Ran Out of Food in the Last Year: Not on file  Transportation Needs:   . Lack of Transportation (Medical): Not on file  . Lack of Transportation (Non-Medical): Not on file  Physical Activity:   . Days of Exercise per Week: Not on file  . Minutes of Exercise per Session: Not on file  Stress:   . Feeling of Stress : Not on file  Social Connections:   . Frequency of Communication with Friends and Family: Not on file  . Frequency of Social Gatherings with Friends and Family: Not on file  . Attends Religious Services: Not on file  . Active Member of Clubs or Organizations: Not on file  . Attends Banker Meetings: Not on file  . Marital Status: Not on file  Intimate Partner Violence:   . Fear of Current or Ex-Partner: Not on file  . Emotionally Abused: Not on file  . Physically Abused: Not on file  . Sexually Abused: Not on file    Family History  Problem Relation Age of Onset  . CAD Sister   . CAD Brother   . Stroke Brother   . Heart attack Mother   . Coronary artery disease Father   . Heart attack Sister     ROS: no fevers or chills, productive  cough, hemoptysis, dysphasia, odynophagia, melena, hematochezia, dysuria, hematuria, rash, seizure activity, orthopnea, PND, pedal edema, claudication. Remaining systems are negative.  Physical Exam: Well-developed well-nourished in no acute distress.  Skin is warm and dry.  HEENT is normal.  Neck is supple.  Right carotid bruit Chest is clear to auscultation with normal expansion.  Cardiovascular exam is regular rate and rhythm.  Abdominal exam nontender or distended. No masses palpated.  Positive bruit Extremities show no edema. neuro grossly intact  ECG-sinus rhythm with occasional  PVC, no ST changes.  Personally reviewed  A/P  1 coronary artery disease-patient denies chest pain.  Continue aspirin.  Intolerant to statins.  2 carotid artery disease-history of carotid endarterectomy-repeat carotid Dopplers.  3 hypertension-blood pressure controlled.  Continue present medications.  4 ischemic cardiomyopathy-continue ACE inhibitor and beta-blocker.  Repeat echocardiogram.  5 hyperlipidemia-continue Repatha.  6 history of lung nodule-schedule noncontrast chest CT for follow-up.  7 bruit-schedule abdominal ultrasound to exclude aneurysm.  Olga Millers, MD

## 2019-03-26 ENCOUNTER — Ambulatory Visit: Payer: Medicare HMO | Admitting: Cardiology

## 2019-03-26 ENCOUNTER — Other Ambulatory Visit: Payer: Self-pay

## 2019-03-26 ENCOUNTER — Encounter: Payer: Self-pay | Admitting: Cardiology

## 2019-03-26 VITALS — BP 127/77 | HR 77 | Ht 70.0 in | Wt 208.0 lb

## 2019-03-26 DIAGNOSIS — E7800 Pure hypercholesterolemia, unspecified: Secondary | ICD-10-CM

## 2019-03-26 DIAGNOSIS — I6523 Occlusion and stenosis of bilateral carotid arteries: Secondary | ICD-10-CM

## 2019-03-26 DIAGNOSIS — I10 Essential (primary) hypertension: Secondary | ICD-10-CM | POA: Diagnosis not present

## 2019-03-26 DIAGNOSIS — I2510 Atherosclerotic heart disease of native coronary artery without angina pectoris: Secondary | ICD-10-CM

## 2019-03-26 DIAGNOSIS — R0989 Other specified symptoms and signs involving the circulatory and respiratory systems: Secondary | ICD-10-CM

## 2019-03-26 DIAGNOSIS — R918 Other nonspecific abnormal finding of lung field: Secondary | ICD-10-CM | POA: Diagnosis not present

## 2019-03-26 NOTE — Patient Instructions (Signed)
Medication Instructions:  NO CHANGE *If you need a refill on your cardiac medications before your next appointment, please call your pharmacy*  Lab Work: If you have labs (blood work) drawn today and your tests are completely normal, you will receive your results only by: Marland Kitchen MyChart Message (if you have MyChart) OR . A paper copy in the mail If you have any lab test that is abnormal or we need to change your treatment, we will call you to review the results.  Testing/Procedures: CT SCAN OF THE CHEST WO CONTRAST TO FOLLOW UP LUNG NODULE AT [address]  Your physician has requested that you have a carotid duplex. This test is an ultrasound of the carotid arteries in your neck. It looks at blood flow through these arteries that supply the brain with blood. Allow one hour for this exam. There are no restrictions or special instructions.[facility] OFFICE  Your physician has requested that you have an abdominal aorta duplex. During this test, an ultrasound is used to evaluate the aorta. Allow 30 minutes for this exam. Do not eat after midnight the day before and avoid carbonated beverages [facility] OFFICE  Your physician has requested that you have an echocardiogram. Echocardiography is a painless test that uses sound waves to create images of your heart. It provides your doctor with information about the size and shape of your heart and how well your heart's chambers and valves are working. This procedure takes approximately one hour. There are no restrictions for this procedure.[address]   Follow-Up: At [facility], you and your health needs are our priority.  As part of our continuing mission to provide you with exceptional heart care, we have created designated Provider Care Teams.  These Care Teams include your primary Cardiologist (physician) and Advanced Practice Providers (APPs -  Physician Assistants and Nurse Practitioners) who all work together to provide you with the  care you need, when you need it.  Your next appointment:   12 month(s)  The format for your next appointment:   Either In Person or Virtual  Provider:   You may see Olga Millers, MD or one of the following Advanced Practice Providers on your designated Care Team:    Corine Shelter, PA-C  [place], [place]  Edd Fabian, [place]

## 2019-03-29 ENCOUNTER — Telehealth: Payer: Self-pay | Admitting: Cardiology

## 2019-03-29 NOTE — Telephone Encounter (Signed)
Spoke w ith patient regarding appointment for chest ct no contrast scheduled 04/19/19 at 11:20 am at [facility] [address]---be sure to take insurance card, photo ID and wear a mask--patient voiced his understanding.

## 2019-03-30 ENCOUNTER — Other Ambulatory Visit: Payer: Self-pay | Admitting: Cardiology

## 2019-03-30 NOTE — Telephone Encounter (Signed)
New Message      *STAT* If patient is at the pharmacy, call can be transferred to refill team.   1. Which medications need to be refilled? (please list name of each medication and dose if known) lisinopril (ZESTRIL) 10 MG tablet  carvedilol (COREG) 12.5 MG tablet  2. Which pharmacy/location (including street and city if local pharmacy) is medication to be sent to? [facility] - [place], [place] - [address]   3. Do they need a 30 day or 90 day supply?  90

## 2019-04-05 ENCOUNTER — Ambulatory Visit (HOSPITAL_COMMUNITY): Payer: Medicare HMO | Attending: Cardiology

## 2019-04-05 ENCOUNTER — Other Ambulatory Visit: Payer: Self-pay

## 2019-04-05 DIAGNOSIS — I2510 Atherosclerotic heart disease of native coronary artery without angina pectoris: Secondary | ICD-10-CM | POA: Diagnosis present

## 2019-04-12 ENCOUNTER — Telehealth: Payer: Self-pay | Admitting: Cardiology

## 2019-04-12 ENCOUNTER — Ambulatory Visit (HOSPITAL_COMMUNITY)
Admission: RE | Admit: 2019-04-12 | Payer: Medicare HMO | Source: Ambulatory Visit | Attending: Cardiology | Admitting: Cardiology

## 2019-04-12 ENCOUNTER — Ambulatory Visit (HOSPITAL_COMMUNITY): Payer: Medicare HMO

## 2019-04-12 MED ORDER — NITROGLYCERIN 0.4 MG SL SUBL: 0.4000 mg | SUBLINGUAL_TABLET | SUBLINGUAL | 3 refills | Status: DC | PRN

## 2019-04-12 MED ORDER — CARVEDILOL 12.5 MG PO TABS: 12.5000 mg | ORAL_TABLET | Freq: Two times a day (BID) | ORAL | 12 refills | Status: DC

## 2019-04-12 MED ORDER — CARVEDILOL 12.5 MG PO TABS: 12.5000 mg | ORAL_TABLET | Freq: Two times a day (BID) | ORAL | 3 refills | Status: DC

## 2019-04-12 MED ORDER — LISINOPRIL 10 MG PO TABS: 10.0000 mg | ORAL_TABLET | Freq: Every day | ORAL | 12 refills | Status: DC

## 2019-04-12 MED ORDER — LISINOPRIL 10 MG PO TABS: 10.0000 mg | ORAL_TABLET | Freq: Every day | ORAL | 3 refills | Status: DC

## 2019-04-12 MED ORDER — NITROGLYCERIN 0.4 MG SL SUBL: 0.4000 mg | SUBLINGUAL_TABLET | SUBLINGUAL | 3 refills | Status: AC | PRN

## 2019-04-12 NOTE — Addendum Note (Signed)
Addended by: Alyson Ingles on: 04/12/2019 03:04 PM   Modules accepted: Orders

## 2019-04-12 NOTE — Telephone Encounter (Signed)
Left detailed message-refill sent to mail in pharmacy

## 2019-04-12 NOTE — Telephone Encounter (Signed)
New Message     *STAT* If patient is at the pharmacy, call can be transferred to refill team.   1. Which medications need to be refilled? (please list name of each medication and dose if known)  lisinopril (ZESTRIL) 10 MG tablet carvedilol (COREG) 12.5 MG tablet nitroGLYCERIN (NITROSTAT) 0.4 MG SL tablet  2. Which pharmacy/location (including street and city if local pharmacy) is medication to be sent to? [facility] - [place], [place] - [address]  3. Do they need a 30 day or 90 day supply? 90  Pt would like a call when they are sent to [facility]  Pt says he is almost out of the Lisinopril and is needing them sent today    Please call

## 2019-04-16 ENCOUNTER — Telehealth: Payer: Self-pay | Admitting: Cardiology

## 2019-04-16 NOTE — Telephone Encounter (Signed)
Spoke with pt, aware he can not use NTG for 24 hours after taking Viagra. Patient voiced understanding to go to the ED for chest pain within 24 hours of viagra use.

## 2019-04-16 NOTE — Telephone Encounter (Signed)
Patient is requesting a call from Dr. Jens Som or his nurse. Did not want to state what the call was in regards to.

## 2019-04-19 ENCOUNTER — Ambulatory Visit
Admission: RE | Admit: 2019-04-19 | Discharge: 2019-04-19 | Disposition: A | Discharge status: Home / Self Care | Payer: Medicare HMO | Source: Ambulatory Visit | Attending: Cardiology | Admitting: Cardiology

## 2019-04-19 DIAGNOSIS — R918 Other nonspecific abnormal finding of lung field: Secondary | ICD-10-CM

## 2019-04-25 ENCOUNTER — Ambulatory Visit (HOSPITAL_BASED_OUTPATIENT_CLINIC_OR_DEPARTMENT_OTHER)
Admission: RE | Admit: 2019-04-25 | Discharge: 2019-04-25 | Disposition: A | Discharge status: Home / Self Care | Payer: Medicare HMO | Source: Ambulatory Visit | Attending: Cardiology | Admitting: Cardiology

## 2019-04-25 ENCOUNTER — Ambulatory Visit (HOSPITAL_COMMUNITY)
Admission: RE | Admit: 2019-04-25 | Discharge: 2019-04-25 | Disposition: A | Discharge status: Home / Self Care | Payer: Medicare HMO | Source: Ambulatory Visit | Attending: Internal Medicine | Admitting: Internal Medicine

## 2019-04-25 ENCOUNTER — Other Ambulatory Visit: Payer: Self-pay

## 2019-04-25 ENCOUNTER — Other Ambulatory Visit (HOSPITAL_COMMUNITY): Payer: Self-pay | Admitting: Cardiology

## 2019-04-25 DIAGNOSIS — E785 Hyperlipidemia, unspecified: Secondary | ICD-10-CM | POA: Diagnosis not present

## 2019-04-25 DIAGNOSIS — I252 Old myocardial infarction: Secondary | ICD-10-CM | POA: Insufficient documentation

## 2019-04-25 DIAGNOSIS — R0989 Other specified symptoms and signs involving the circulatory and respiratory systems: Secondary | ICD-10-CM | POA: Insufficient documentation

## 2019-04-25 DIAGNOSIS — Z87891 Personal history of nicotine dependence: Secondary | ICD-10-CM

## 2019-04-25 DIAGNOSIS — I6523 Occlusion and stenosis of bilateral carotid arteries: Secondary | ICD-10-CM | POA: Diagnosis not present

## 2019-04-25 DIAGNOSIS — I2510 Atherosclerotic heart disease of native coronary artery without angina pectoris: Secondary | ICD-10-CM | POA: Diagnosis not present

## 2019-04-25 DIAGNOSIS — Z8673 Personal history of transient ischemic attack (TIA), and cerebral infarction without residual deficits: Secondary | ICD-10-CM | POA: Insufficient documentation

## 2019-04-25 DIAGNOSIS — I110 Hypertensive heart disease with heart failure: Secondary | ICD-10-CM | POA: Insufficient documentation

## 2019-04-25 DIAGNOSIS — Z9889 Other specified postprocedural states: Secondary | ICD-10-CM

## 2019-04-26 ENCOUNTER — Other Ambulatory Visit: Payer: Self-pay | Admitting: *Deleted

## 2019-04-26 DIAGNOSIS — I6523 Occlusion and stenosis of bilateral carotid arteries: Secondary | ICD-10-CM

## 2020-01-02 ENCOUNTER — Telehealth: Payer: Self-pay | Admitting: Pharmacist

## 2020-01-02 DIAGNOSIS — E7800 Pure hypercholesterolemia, unspecified: Secondary | ICD-10-CM

## 2020-01-02 NOTE — Telephone Encounter (Signed)
Medication Samples have been provided to the patient.  Drug name: REPATHA SURECLICK      Strength: 140MG       Qty: 2  LOT  Exp.Date: 08/23   Repatha samples, and paperwork for [facility] safety net provided.  Patient is to complete form and repeat fasting blood work ASAP for [facility] on December.

## 2020-01-11 LAB — LIPID PANEL
Chol/HDL Ratio: 2.8 ratio (ref 0.0–5.0)
Cholesterol, Total: 111 mg/dL (ref 100–199)
HDL: 40 mg/dL (ref >39)
LDL Chol Calc (NIH): 53 mg/dL (ref 0–99)
Triglycerides: 94 mg/dL (ref 0–149)
VLDL Cholesterol Cal: 18 mg/dL (ref 5–40)

## 2020-02-01 ENCOUNTER — Encounter: Payer: Self-pay | Admitting: Cardiology

## 2020-02-01 NOTE — Telephone Encounter (Signed)
No note needed 

## 2020-03-05 ENCOUNTER — Other Ambulatory Visit: Payer: Self-pay | Admitting: Cardiology

## 2020-03-05 ENCOUNTER — Telehealth: Payer: Self-pay

## 2020-03-05 MED ORDER — REPATHA SURECLICK 140 MG/ML ~~LOC~~ SOAJ: 140.0000 mg | SUBCUTANEOUS | 3 refills | Status: DC

## 2020-03-05 NOTE — Telephone Encounter (Signed)
[facility] renewed and approved.   New Rx for 90 day supply sent to prefer pharmacy

## 2020-03-05 NOTE — Telephone Encounter (Signed)
Patient called and has a question about his Repatha.  He would like to speak to a pharmacist and would not elaborate.  Please assist, thank you

## 2020-04-01 ENCOUNTER — Other Ambulatory Visit: Payer: Self-pay | Admitting: Cardiology

## 2020-04-01 NOTE — Progress Notes (Signed)
Virtual Visit via Telephone Note   This visit type was conducted due to national recommendations for restrictions regarding the COVID-19 Pandemic (e.g. social distancing) in an effort to limit this patient's exposure and mitigate transmission in our community.  Due to his co-morbid illnesses, this patient is at least at moderate risk for complications without adequate follow up.  This format is felt to be most appropriate for this patient at this time.  The patient did not have access to video technology/had technical difficulties with video requiring transitioning to audio format only (telephone).  All issues noted in this document were discussed and addressed.  No physical exam could be performed with this format.  Please refer to the patient's chart for his  consent to telehealth for [facility].  Evaluation Performed:  Follow-up visit  This visit type was conducted due to national recommendations for restrictions regarding the COVID-19 Pandemic (e.g. social distancing).  This format is felt to be most appropriate for this patient at this time.  All issues noted in this document were discussed and addressed.  No physical exam was performed (except for noted visual exam findings with Video Visits).  Please refer to the patient's chart (MyChart message for video visits and phone note for telephone visits) for the patient's consent to telehealth for [facility]  Date:  04/02/2020   ID:  Adam Andrews, DOB 05/04/1950, MRN [id number]  Patient Location:  [address] [facility] [place] [address]   Provider location:     [facility] [address] Office [phone] Fax [phone]   PCP:  Salli Real, MD  Cardiologist:  Olga Millers, MD  Electrophysiologist:  None   Chief Complaint: Follow-up for coronary artery disease and hypertension.  History of Present Illness:    Adam Andrews is a 70 y.o. male who  presents via audio/video conferencing for a telehealth visit today.  Patient verified DOB and address.  He underwent cardiac catheterization with PCI of his RCA 8/10.  He was admitted 2/18 acute anterior infarct.  He was found to have a 99% occluded proximal LAD which was treated with DES.  His echocardiogram 2/18 showed an EF 35-40%, G1 DD.  He had a CVA 8/18 followed by R CEA 8/18.  CTA of his neck 8/18 showed 4 mm left upper lobe pulmonary nodule not recommended follow-up imaging in 12 months.  His carotid Doppler 6/19 showed patent right carotid endarterectomy site and 1-39% left ICA.  He was last seen by Dr. Jens Som on 03/26/2019.  During that time he noted dyspnea with vigorous physical activity however not with routine daily activities.  He denied chest pain, palpitations, and syncope.  He is seen virtually today in follow-up and states he continues to stay very physically active working in his yard, cutting wood, and doing chores around his house.  He reports that he is able to climb stairs without difficulty however, he does have trouble going downstairs due to his knee pain.  He has been getting injections every 90 days, getting his knee drained, and receiving treatment from orthopedics.  He denies use of nitroglycerin but does report one episode of chest pain that was at rest.  He denies any exertional chest pain.  We reviewed proper use of nitroglycerin and may potentially deadly side effects of using sildenafil in combination with nitroglycerin.  He expressed understanding.  He has received both COVID-19 vaccinations.  I will give him a salty 6  diet sheet, repeat his carotid Dopplers, have him increase his physical activity as tolerated, and have him follow-up in 12 months.  Today he denies chest pain, shortness of breath, lower extremity edema, fatigue, palpitations, melena, hematuria, hemoptysis, diaphoresis, weakness, presyncope, syncope, orthopnea, and PND.   The patient does not symptoms  concerning for COVID-19 infection (fever, chills, cough, or new SHORTNESS OF BREATH).    Prior CV studies:   The following studies were reviewed today:  EKG 03/26/2019 Sinus rhythm with occasional PVCs, no ST or T wave deviation.  Echocardiogram 04/05/2019 IMPRESSIONS    1. Akinesis of the inferolateral wall with overall mild to moderate LV  dysfunction; grade 1 diastolic dysfunction.  2. Left ventricular ejection fraction, by estimation, is 40 to 45%. The  left ventricle has mild to moderately decreased function. The left  ventrical demonstrates regional wall motion abnormalities (see scoring  diagram/findings for description). Left  ventricular diastolic parameters are consistent with Grade I diastolic  dysfunction (impaired relaxation).  3. Right ventricular systolic function is normal. The right ventricular  size is normal.  4. The mitral valve is normal in structure and function. trivial mitral  valve regurgitation. No evidence of mitral stenosis.  5. The aortic valve is normal in structure and function. Aortic valve  regurgitation is not visualized. No aortic stenosis is present.  6. The inferior vena cava is normal in size with greater than 50%  respiratory variability, suggesting right atrial pressure of 3 mmHg.  Past Medical History:  Diagnosis Date  . CAD (coronary artery disease)   . Cardiomyopathy   . Carotid stenosis, right   . Cataract    B/L  . Cerebrovascular disease   . Chronic pain    B/LLE  . Coronary artery disease   . Dyslipidemia   . Enlarged prostate   . History of cardiac cath   . Hypertension    Benign  . Hypertension   . Myocardial infarction ([facility])   . Numbness and tingling in left hand   . S/P angioplasty with stent, 04/15/16  DES to prox LAD 04/17/2016  . Stroke ([facility])   . TIA (transient ischemic attack)    Past Surgical History:  Procedure Laterality Date  . CARDIAC CATHETERIZATION    . CAROTID ENDARTERECTOMY    . CORONARY STENT  INTERVENTION N/A 04/15/2016   Procedure: Coronary Stent Intervention;  Surgeon: Corky Crafts, MD;  Location: [facility] INVASIVE CV LAB;  Service: Cardiovascular;  Laterality: N/A;  . ENDARTERECTOMY Right 10/05/2016   Procedure: ENDARTERECTOMY RIGHT CAROTID ARTERY;  Surgeon: [name], MD;  Location: [facility] OR;  Service: Vascular;  Laterality: Right;  . LEFT HEART CATH AND CORONARY ANGIOGRAPHY N/A 04/15/2016   Procedure: Left Heart Cath and Coronary Angiography;  Surgeon: Corky Crafts, MD;  Location: [facility] INVASIVE CV LAB;  Service: Cardiovascular;  Laterality: N/A;  . MULTIPLE TOOTH EXTRACTIONS    . PATCH ANGIOPLASTY Right 10/05/2016   Procedure: RIGHT CAROTID ARTERY PATCH ANGIOPLASTY USING Livia Snellen VASCULAR PATCH;  Surgeon: [name], MD;  Location: [facility];  Service: Vascular;  Laterality: Right;     Current Meds  Medication Sig  . aspirin 81 MG chewable tablet Chew 1 tablet (81 mg total) by mouth daily. (Patient taking differently: Chew 81 mg by mouth at bedtime.)  . carvedilol (COREG) 12.5 MG tablet Take 1 tablet (12.5 mg total) by mouth 2 (two) times daily.  . Cholecalciferol 125 MCG (5000 UT) capsule Take 5,000 Units by mouth at bedtime.   Marland Kitchen  Evolocumab (REPATHA SURECLICK) 140 MG/ML SOAJ Inject 140 mg into the skin every 14 (fourteen) days.  Marland Kitchen ibuprofen (ADVIL,MOTRIN) 200 MG tablet Take 200 mg by mouth every 6 (six) hours as needed.  Marland Kitchen lisinopril (ZESTRIL) 10 MG tablet TAKE 1 TABLET EVERY DAY  . MAGNESIUM OXIDE 400 PO Take 400 mg by mouth.  . Multiple Vitamin (MULTIVITAMIN WITH MINERALS) TABS Take 1 tablet by mouth daily.  . nitroGLYCERIN (NITROSTAT) 0.4 MG SL tablet Place 1 tablet (0.4 mg total) under the tongue every 5 (five) minutes as needed. For chest pain.  May repeat times three.  . tamsulosin (FLOMAX) 0.4 MG CAPS capsule Take 1 capsule (0.4 mg total) by mouth daily. (Patient taking differently: Take 0.4 mg by mouth daily as needed (prostate pain).)     Allergies:   Patient has  no known allergies.   Social History   Tobacco Use  . Smoking status: Former Smoker    Types: Cigarettes  . Smokeless tobacco: Never Used  . Tobacco comment: Does not smoke.  Vaping Use  . Vaping Use: Never used  Substance Use Topics  . Alcohol use: Yes    Alcohol/week: 14.0 standard drinks    Types: 14 Cans of beer per week    Comment: 14 cans of beer weekly  . Drug use: No     Family Hx: The patient's family history includes CAD in his brother and sister; Coronary artery disease in his father; Heart attack in his mother and sister; Stroke in his brother.  ROS:   Please see the history of present illness.     All other systems reviewed and are negative.   Labs/Other Tests and Data Reviewed:    Recent Labs: No results found for requested labs within last 8760 hours.   Recent Lipid Panel Lab Results  Component Value Date/Time   CHOL 111 01/11/2020 09:09 AM   TRIG 94 01/11/2020 09:09 AM   HDL 40 01/11/2020 09:09 AM   CHOLHDL 2.8 01/11/2020 09:09 AM   CHOLHDL 5.3 09/30/2016 04:31 AM   LDLCALC 53 01/11/2020 09:09 AM    Wt Readings from Last 3 Encounters:  04/02/20 206 lb (93.4 kg)  03/26/19 208 lb (94.3 kg)  08/08/17 209 lb (94.8 kg)     Exam:    Vital Signs:  Wt 206 lb (93.4 kg)   BMI 29.56 kg/m    Well nourished, well developed male in no  acute distress.   ASSESSMENT & PLAN:    1.  Coronary artery disease-no chest pain today.  No recent episodes of arm neck or jaw discomfort. Continue aspirin, carvedilol, lisinopril, nitroglycerin, Repatha Heart healthy low-sodium diet-salty 6 given Increase physical activity as tolerated  Carotid artery disease-right CEA 8/18.  Carotid Dopplers 04/25/2019 showed 1-39-39% bilateral ICA stenosis Continue Repatha, aspirin Repeat carotid Dopplers   Essential hypertension-BP today unavaliable.  Well-controlled at home Continue lisinopril, carvedilol Heart healthy low-sodium diet-salty 6 given Increase physical  activity as tolerated  Ischemic cardiomyopathy-no increased DOE or activity intolerance.  Echocardiogram showed EF of 40-45% Continue carvedilol, lisinopril Heart healthy low-sodium diet-salty 6 given Increase physical activity as tolerated  Hyperlipidemia-01/11/2020: Cholesterol, Total 111; HDL 40; LDL Chol Calc (NIH) 53; Triglycerides 94 Continue Repatha Heart healthy low-sodium high-fiber diet Increase physical activity as tolerated  Lung nodule-history of lung nodule identified on CTA.  Follow-up chest CT 04/19/2019 showed stable 3.5 mm left upper lobe pulmonary nodule, considered benign  COVID-19 Education: The signs and symptoms of COVID-19 were discussed with the patient  and how to seek care for testing (follow up with PCP or arrange E-visit).  The importance of social distancing was discussed today.  Patient Risk:   After full review of this patients clinical status, I feel that they are at least moderate risk at this time.  Time:   Today, I have spent 26 minutes with the patient with telehealth technology discussing diet, medication, exercise, CAD and carotid stenosis.  Prior to his visit I spent 20 minutes reviewing his past medical history, cardiac test, and medications.   Medication Adjustments/Labs and Tests Ordered: Current medicines are reviewed at length with the patient today.  Concerns regarding medicines are outlined above.   Tests Ordered: No orders of the defined types were placed in this encounter.  Medication Changes: No orders of the defined types were placed in this encounter.   Disposition:  in 1 year(s)  Signed, Thomasene Ripple. Marizol Borror NP-C    09/26/2018 11:58 AM    [facility] [address] Office [phone] Fax [phone]

## 2020-04-02 ENCOUNTER — Encounter: Payer: Self-pay | Admitting: General Practice

## 2020-04-02 ENCOUNTER — Telehealth (INDEPENDENT_AMBULATORY_CARE_PROVIDER_SITE_OTHER): Payer: Medicare HMO | Admitting: General Practice

## 2020-04-02 VITALS — Wt 206.0 lb

## 2020-04-02 DIAGNOSIS — R911 Solitary pulmonary nodule: Secondary | ICD-10-CM

## 2020-04-02 DIAGNOSIS — I6523 Occlusion and stenosis of bilateral carotid arteries: Secondary | ICD-10-CM

## 2020-04-02 DIAGNOSIS — I10 Essential (primary) hypertension: Secondary | ICD-10-CM

## 2020-04-02 DIAGNOSIS — I2510 Atherosclerotic heart disease of native coronary artery without angina pectoris: Secondary | ICD-10-CM

## 2020-04-02 DIAGNOSIS — E7800 Pure hypercholesterolemia, unspecified: Secondary | ICD-10-CM

## 2020-04-02 DIAGNOSIS — Z7189 Other specified counseling: Secondary | ICD-10-CM

## 2020-04-02 DIAGNOSIS — I255 Ischemic cardiomyopathy: Secondary | ICD-10-CM | POA: Diagnosis not present

## 2020-04-02 NOTE — Patient Instructions (Signed)
Medication Instructions:  The current medical regimen is effective;  continue present plan and medications as directed. Please refer to the Current Medication list given to you today.  *If you need a refill on your cardiac medications before your next appointment, please call your pharmacy*  Lab Work:    NONE  Testing/Procedures:  Your physician has requested that you have a carotid duplex. This test is an ultrasound of the carotid arteries in your neck. It looks at blood flow through these arteries that supply the brain with blood. Allow one hour for this exam. There are no restrictions or special instructions.  Special Instructions PLEASE READ AND FOLLOW SALTY 6-ATTACHED-1,800mg  daily  PLEASE MINTAIN PHYSICAL ACTIVITY AS TOLERATED  Follow-Up: Your next appointment:  12 month(s) In Person with You may see Olga Millers, MD OR IF UNAVAILABLE JESSE CLEAVER, FNP-C or one of the following Advanced Practice Providers on your designated Care Team:  Corine Shelter, [place] OR Marjie Skiff, [place]   Please call our office 2 months in advance to schedule this appointment   At [facility], you and your health needs are our priority.  As part of our continuing mission to provide you with exceptional heart care, we have created designated Provider Care Teams.  These Care Teams include your primary Cardiologist (physician) and Advanced Practice Providers (APPs -  Physician Assistants and Nurse Practitioners) who all work together to provide you with the care you need, when you need it.            6 SALTY THINGS TO AVOID     1,800MG  DAILY

## 2020-04-24 ENCOUNTER — Ambulatory Visit (HOSPITAL_COMMUNITY)
Admission: RE | Admit: 2020-04-24 | Discharge: 2020-04-24 | Disposition: A | Discharge status: Home / Self Care | Payer: Medicare HMO | Source: Ambulatory Visit | Attending: Cardiovascular Disease | Admitting: Cardiovascular Disease

## 2020-04-24 ENCOUNTER — Other Ambulatory Visit: Payer: Self-pay

## 2020-04-24 ENCOUNTER — Other Ambulatory Visit (HOSPITAL_COMMUNITY): Payer: Self-pay | Admitting: Cardiology

## 2020-04-24 ENCOUNTER — Encounter: Payer: Self-pay | Admitting: *Deleted

## 2020-04-24 DIAGNOSIS — Z9889 Other specified postprocedural states: Secondary | ICD-10-CM | POA: Diagnosis not present

## 2020-05-17 ENCOUNTER — Other Ambulatory Visit: Payer: Self-pay | Admitting: Cardiology

## 2020-05-19 ENCOUNTER — Telehealth: Payer: Self-pay | Admitting: Cardiology

## 2020-05-19 NOTE — Telephone Encounter (Signed)
Pt c/o medication issue:  . *STAT* If patient is at the pharmacy, call can be transferred to refill team.   1. Which medications need to be refilled? (please list name of each medication and dose if known)  carvedilol (COREG) 12.5 MG tablet [[id number]]    2. Which pharmacy/location (including street and city if local pharmacy) is medication to be sent to? [facility] on [address]    3. Do they need a 30 day or 90 day supply?  30  He stated he just needs a small supply sent to his local pharmacy just to make it til he can get the mail order in     Best number [phone]

## 2020-05-20 ENCOUNTER — Other Ambulatory Visit: Payer: Self-pay | Admitting: Cardiology

## 2020-05-20 ENCOUNTER — Other Ambulatory Visit: Payer: Self-pay

## 2020-05-20 MED ORDER — CARVEDILOL 12.5 MG PO TABS: 12.5000 mg | ORAL_TABLET | Freq: Two times a day (BID) | ORAL | 0 refills | Status: DC

## 2020-05-20 NOTE — Telephone Encounter (Signed)
    Pt called back to follow up his refill, he is about to ran out of meds and needs emergency refill while waiting for his prescription in the mail

## 2020-08-30 IMAGING — CT CT CHEST W/O CM
2 of 4 series · 11 of 36 positions shown, 13 images · non-contrast
Comparison: Neck CT 09/30/2016

CLINICAL DATA: Followup pulmonary nodule.

EXAM:
CT CHEST WITHOUT CONTRAST
TECHNIQUE: Multidetector CT imaging of the chest was performed following the
standard protocol without IV contrast.

[Series 2: chest 2.00 br40 s3 · axial · 0.64mm/px · z∈[+1490,+1788]mm · 8 of 177 slices shown, 10 images (1 of 2)]
[im 14/177  mediastinal]
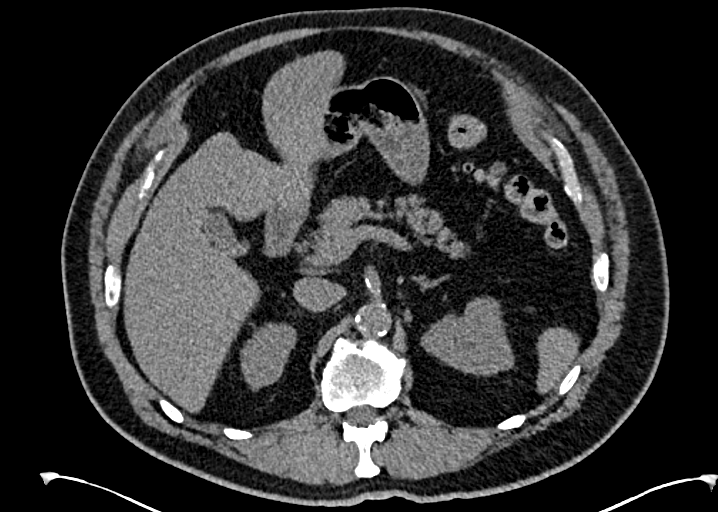
[im 14/177  lung]
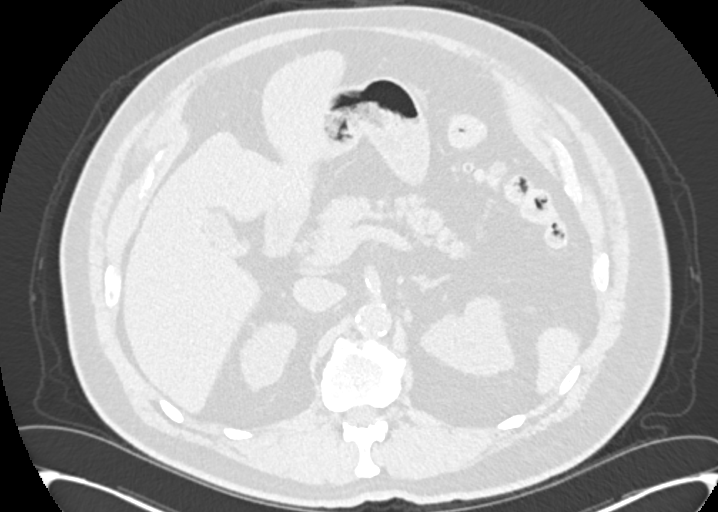
[im 41/177  lung]
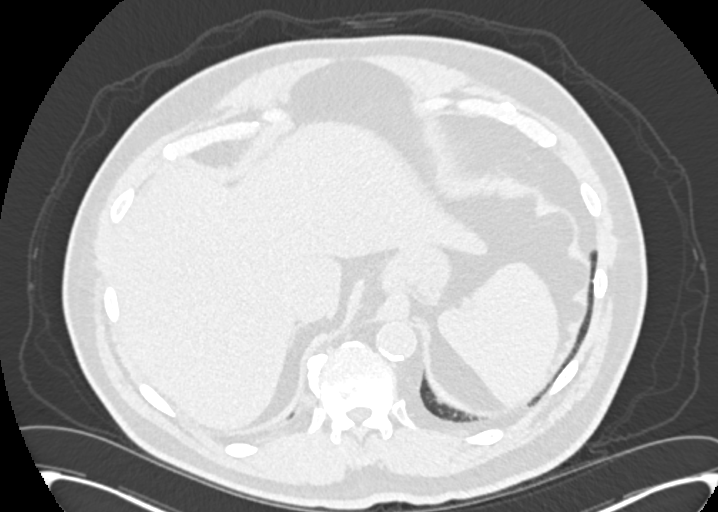
[im 55/177  lung]
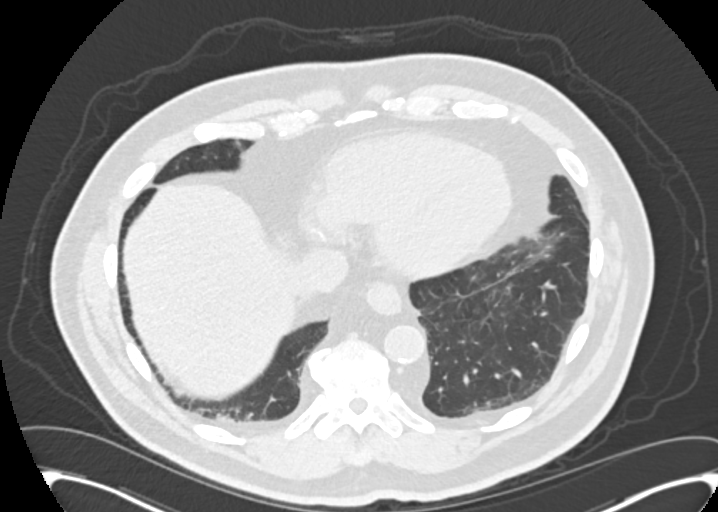
[im 82/177  lung]
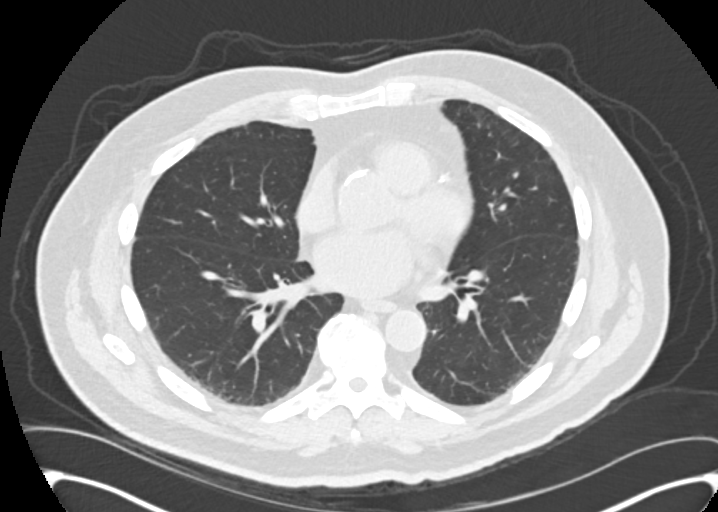
[im 95/177  mediastinal]
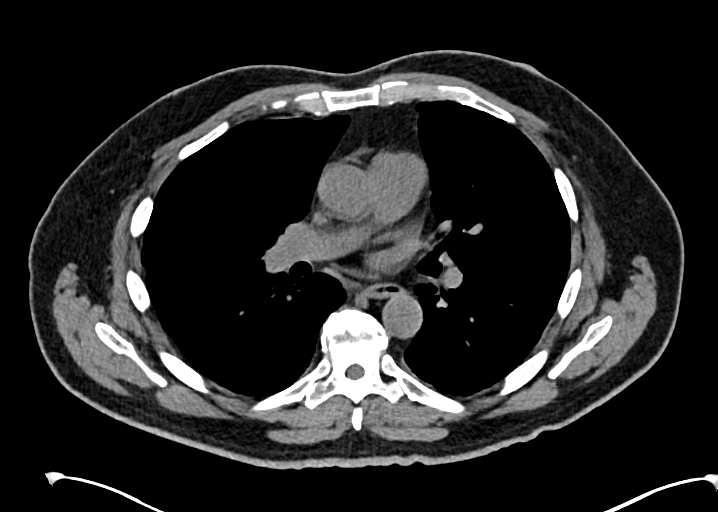
[im 95/177  lung]
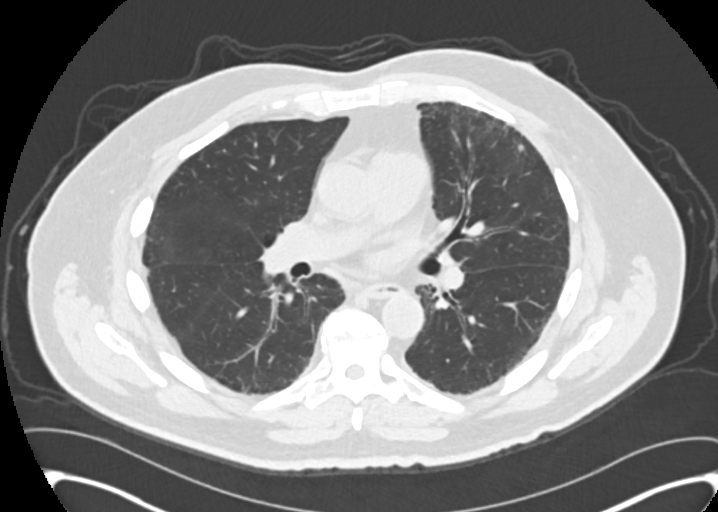
[im 122/177  lung]
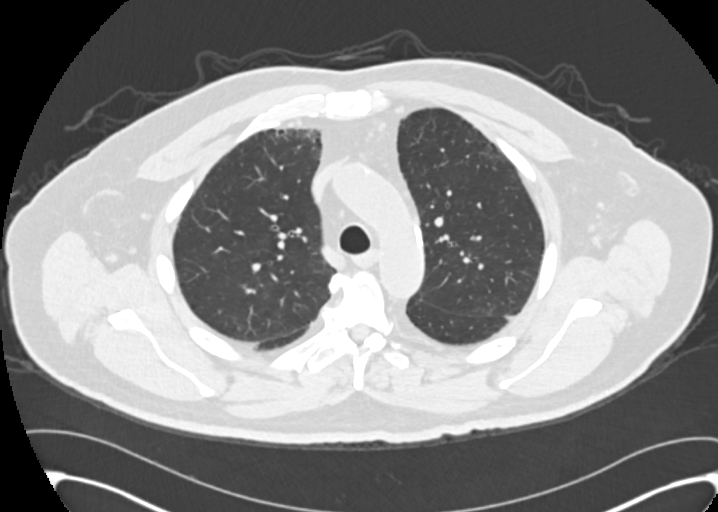
[im 136/177  lung]
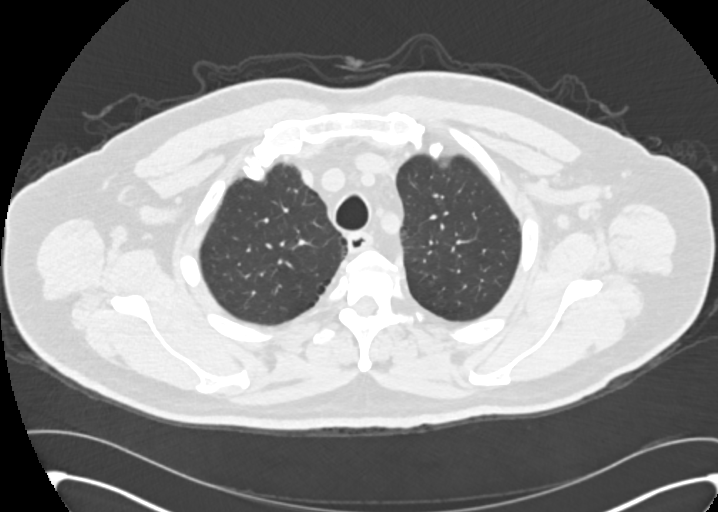
[im 163/177  lung]
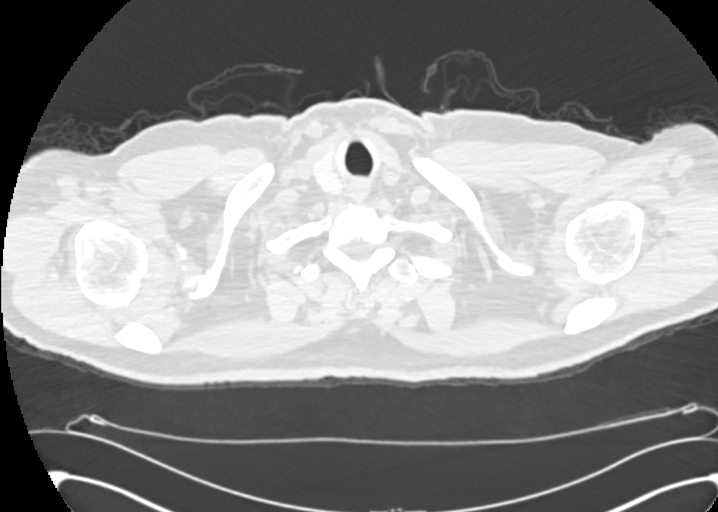

[Series 4: chest 2.00 br40 s3 · coronal · 0.69mm/px · 3 of 164 slices shown (2 of 2)]
[im 33/164  lung]
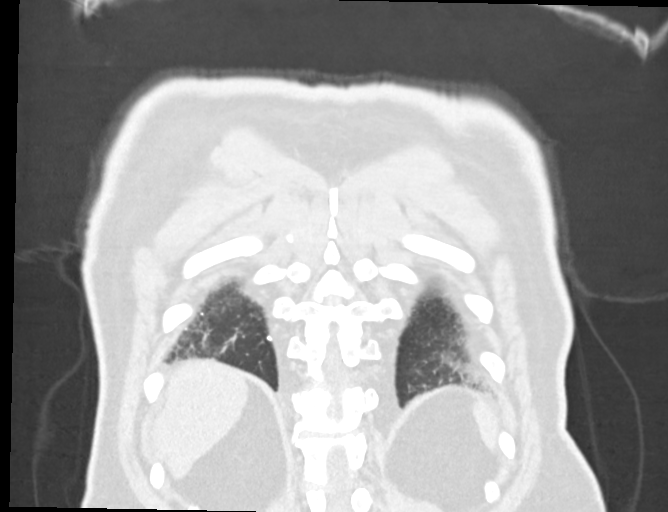
[im 66/164  lung]
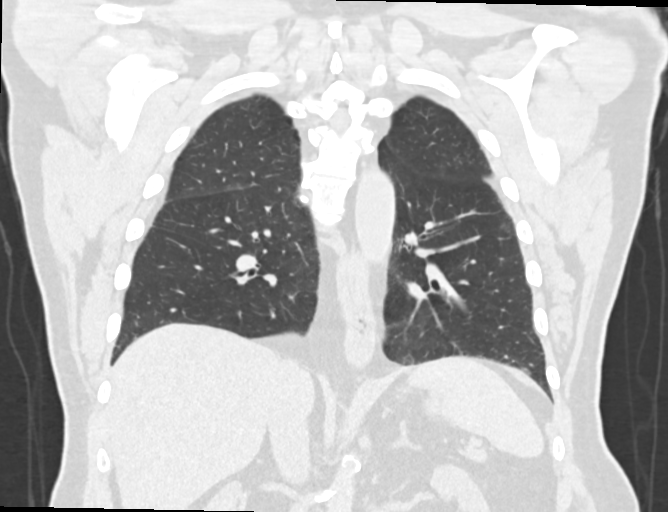
[im 98/164  lung]
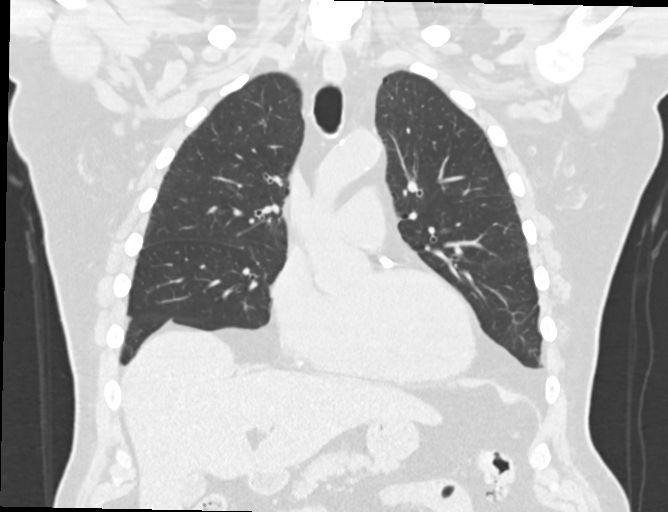

[11 of 36 positions shown; findings below may reference images not displayed]

FINDINGS: Cardiovascular: The heart is normal in size. No pericardial
effusion. The aorta is normal in caliber. Moderate scattered
atherosclerotic calcifications. No aneurysm. Age advanced 2 vessel
coronary artery calcifications.

Mediastinum/Nodes: Small scattered mediastinal and hilar lymph nodes
but no mass or overt adenopathy. The esophagus is grossly normal.

Lungs/Pleura: Changes of paraseptal and centrilobar emphysema are
again noted with peripheral interstitial scarring type changes. No
infiltrates or effusions. No pulmonary edema. Stable 3.5 mm left
upper lobe pulmonary nodule on image 83/8.

Small calcified granuloma noted at the left lung base on image
119/8. No new pulmonary nodules. No worrisome pulmonary lesions. The
tracheobronchial tree is grossly normal.

Upper Abdomen: No significant upper abdominal findings. No hepatic
or adrenal gland lesions. Age advanced aortic and branch vessel
calcifications.

Musculoskeletal: No chest wall mass, supraclavicular or axillary
adenopathy. Small scattered lymph nodes are noted. Prior left
thyroid lobectomy. The bony thorax is intact.
IMPRESSION: 1. Stable 3.5 mm left upper lobe pulmonary nodule since 2374,
considered benign. No other pulmonary nodules or acute pulmonary
findings.
2. Emphysematous changes and peripheral interstitial scarring type
changes.
3. No mediastinal or hilar mass or adenopathy.
4. Age advanced 2 vessel coronary artery calcifications.

Aortic Atherosclerosis (YLQ5Q-XAO.O) and Emphysema (YLQ5Q-H8H.R).

Aortic Atherosclerosis (YLQ5Q-XAO.O) and Emphysema (YLQ5Q-H8H.R).

## 2020-12-09 ENCOUNTER — Other Ambulatory Visit: Payer: Self-pay

## 2020-12-09 DIAGNOSIS — E785 Hyperlipidemia, unspecified: Secondary | ICD-10-CM

## 2021-01-21 ENCOUNTER — Other Ambulatory Visit: Payer: Self-pay | Admitting: Cardiology

## 2021-01-24 ENCOUNTER — Other Ambulatory Visit: Payer: Self-pay | Admitting: Cardiology

## 2021-03-19 ENCOUNTER — Other Ambulatory Visit: Payer: Self-pay

## 2021-03-19 MED ORDER — CARVEDILOL 12.5 MG PO TABS: 12.5000 mg | ORAL_TABLET | Freq: Two times a day (BID) | ORAL | 3 refills | Status: DC

## 2021-04-24 ENCOUNTER — Ambulatory Visit (HOSPITAL_COMMUNITY): Payer: Medicare HMO

## 2021-05-31 ENCOUNTER — Other Ambulatory Visit: Payer: Self-pay | Admitting: Cardiology

## 2021-06-29 ENCOUNTER — Telehealth: Payer: Self-pay | Admitting: Cardiology

## 2021-06-29 ENCOUNTER — Other Ambulatory Visit: Payer: Self-pay

## 2021-06-29 DIAGNOSIS — Z9889 Other specified postprocedural states: Secondary | ICD-10-CM

## 2021-06-29 NOTE — Telephone Encounter (Signed)
Per last result note- repeat carotid testing in one year.   ?I placed Carotid US- will route to scheduling to schedule. ?Thanks! ?

## 2021-06-29 NOTE — Telephone Encounter (Signed)
Patient calling to schedule carotid test, but I did not see an active order. ?

## 2021-07-15 ENCOUNTER — Ambulatory Visit (HOSPITAL_COMMUNITY)
Admission: RE | Admit: 2021-07-15 | Discharge: 2021-07-15 | Disposition: A | Discharge status: Home / Self Care | Payer: Medicare HMO | Source: Ambulatory Visit | Attending: Cardiovascular Disease | Admitting: Cardiovascular Disease

## 2021-07-15 DIAGNOSIS — Z8673 Personal history of transient ischemic attack (TIA), and cerebral infarction without residual deficits: Secondary | ICD-10-CM

## 2021-07-15 DIAGNOSIS — Z9889 Other specified postprocedural states: Secondary | ICD-10-CM | POA: Diagnosis present

## 2021-07-21 ENCOUNTER — Encounter: Payer: Self-pay | Admitting: *Deleted

## 2021-08-29 ENCOUNTER — Other Ambulatory Visit: Payer: Self-pay | Admitting: Cardiology

## 2022-01-24 ENCOUNTER — Other Ambulatory Visit: Payer: Self-pay | Admitting: Cardiology

## 2022-01-29 ENCOUNTER — Other Ambulatory Visit: Payer: Self-pay | Admitting: Cardiology

## 2022-03-11 ENCOUNTER — Other Ambulatory Visit (HOSPITAL_COMMUNITY): Payer: Self-pay

## 2022-04-08 ENCOUNTER — Other Ambulatory Visit: Payer: Self-pay | Admitting: Cardiology

## 2022-04-29 ENCOUNTER — Other Ambulatory Visit: Payer: Self-pay | Admitting: Cardiology

## 2022-06-02 ENCOUNTER — Other Ambulatory Visit: Payer: Self-pay | Admitting: Cardiology

## 2022-06-09 ENCOUNTER — Other Ambulatory Visit: Payer: Self-pay | Admitting: *Deleted

## 2022-06-09 DIAGNOSIS — Z9889 Other specified postprocedural states: Secondary | ICD-10-CM

## 2022-07-15 ENCOUNTER — Ambulatory Visit (HOSPITAL_COMMUNITY)
Admission: RE | Admit: 2022-07-15 | Discharge: 2022-07-15 | Disposition: A | Discharge status: Home / Self Care | Payer: Medicare HMO | Source: Ambulatory Visit | Attending: Cardiology | Admitting: Cardiology

## 2022-07-15 DIAGNOSIS — I6521 Occlusion and stenosis of right carotid artery: Secondary | ICD-10-CM

## 2022-07-15 DIAGNOSIS — Z9889 Other specified postprocedural states: Secondary | ICD-10-CM | POA: Insufficient documentation

## 2022-10-18 ENCOUNTER — Telehealth: Payer: Self-pay | Admitting: Cardiology

## 2022-10-18 MED ORDER — LISINOPRIL 10 MG PO TABS: 10.0000 mg | ORAL_TABLET | Freq: Every day | ORAL | 0 refills | Status: DC

## 2022-10-18 NOTE — Telephone Encounter (Signed)
Refill complete 

## 2022-10-18 NOTE — Telephone Encounter (Signed)
 *  STAT* If patient is at the pharmacy, call can be transferred to refill team.   1. Which medications need to be refilled? (please list name of each medication and dose if known)   lisinopril (ZESTRIL) 10 MG tablet     2. Would you like to learn more about the convenience, safety, & potential cost savings by using the [facility]? No    3. Are you open to using the [facility] (Type [facility]. No    4. Which pharmacy/location (including street and city if local pharmacy) is medication to be sent to?[facility] - [place], [place] - [address]    5. Do they need a 30 day or 90 day supply? 90 days   Pt made an appt with Dr. Jens Som on 11/10/22 at 4 pm

## 2022-11-05 NOTE — Progress Notes (Unsigned)
HPI: FU CAD. Patient had PCI of his RCA in August 2010. Patient was admitted February 2018 with acute anterior infarct. He was found to have a 99% proximal LAD which was treated with a drug-eluting stent. Had CVA 8/18 followed by R CEA 8/18. Echocardiogram February 2021 showed ejection fraction 40 to 45% with akinesis of the inferolateral wall, grade 1 diastolic dysfunction.  Abdominal ultrasound March 2021 showed no aneurysm.  Carotid Dopplers May 2024 showed patent carotid endarterectomy site on the right and 1 to 39% left stenosis.  Since last seen, the patient denies any dyspnea on exertion, orthopnea, PND, pedal edema, palpitations, syncope or chest pain.   Current Outpatient Medications  Medication Sig Dispense Refill   Ascorbic Acid (VITAMIN C) 1000 MG tablet Take 1,000 mg by mouth daily.     aspirin 81 MG chewable tablet Chew 1 tablet (81 mg total) by mouth daily. (Patient taking differently: Chew 81 mg by mouth at bedtime.)     carvedilol (COREG) 12.5 MG tablet TAKE 1 TABLET TWICE DAILY 180 tablet 3   Cholecalciferol 125 MCG (5000 UT) capsule Take 5,000 Units by mouth at bedtime.      Evolocumab (REPATHA SURECLICK) 140 MG/ML SOAJ INJECT 140 MG INTO THE SKIN EVERY 14 DAYS 6 mL 3   ibuprofen (ADVIL,MOTRIN) 200 MG tablet Take 200 mg by mouth every 6 (six) hours as needed.     lisinopril (ZESTRIL) 10 MG tablet Take 1 tablet (10 mg total) by mouth daily. 90 tablet 0   nitroGLYCERIN (NITROSTAT) 0.4 MG SL tablet Place 1 tablet (0.4 mg total) under the tongue every 5 (five) minutes as needed. For chest pain.  May repeat times three. 50 tablet 3   NON FORMULARY Take 750 drops by mouth as needed (for sleep). CBD oil     sildenafil (REVATIO) 20 MG tablet Take 20 mg by mouth daily as needed.     tamsulosin (FLOMAX) 0.4 MG CAPS capsule Take 1 capsule (0.4 mg total) by mouth daily. (Patient taking differently: Take 0.4 mg by mouth daily as needed (prostate pain).) 30 capsule 0   No current  facility-administered medications for this visit.     Past Medical History:  Diagnosis Date   CAD (coronary artery disease)    Cardiomyopathy    Carotid stenosis, right    Cataract    B/L   Cerebrovascular disease    Chronic pain    B/LLE   Coronary artery disease    Dyslipidemia    Enlarged prostate    History of cardiac cath    Hypertension    Benign   Hypertension    Myocardial infarction (HCC)    Numbness and tingling in left hand    S/P angioplasty with stent, 04/15/16  DES to prox LAD 04/17/2016   Stroke (HCC)    TIA (transient ischemic attack)     Past Surgical History:  Procedure Laterality Date   CARDIAC CATHETERIZATION     CAROTID ENDARTERECTOMY     CORONARY STENT INTERVENTION N/A 04/15/2016   Procedure: Coronary Stent Intervention;  Surgeon: Corky Crafts, MD;  Location: [facility]AS[facility];  Service: Cardiovascular;  Laterality: N/A;   ENDARTERECTOMY Right 10/05/2016   Procedure: ENDARTERECTOMY RIGHT CAROTID ARTERY;  Surgeon: [name], MD;  Location: [facility] OR;  Service: Vascular;  Laterality: Right;   LEFT HEART CATH AND CORONARY ANGIOGRAPHY N/A 04/15/2016   Procedure: Left Heart Cath and Coronary Angiography;  Surgeon: Corky Crafts, MD;  Location:  [facility]VAS[facility] LAB;  Service: Cardiovascular;  Laterality: N/A;   MULTIPLE TOOTH EXTRACTIONS     PATCH ANGIOPLASTY Right 10/05/2016   Procedure: RIGHT CAROTID ARTERY PATCH ANGIOPLASTY USING XENOSURE VASCULAR PATCH;  Surgeon: [name], MD;  Location: [facility] OR;  Service: Vascular;  Laterality: Right;    Social History   Socioeconomic History   Marital status: Married    Spouse name: Not on file   Number of children: Not on file   Years of education: Not on file   Highest education level: Not on file  Occupational History   Occupation: Investment banker, corporate: [employer]  Tobacco Use   Smoking status: Former    Types: Cigarettes   Smokeless tobacco: Never   Tobacco comments:    Does not smoke.   Vaping Use   Vaping status: Never Used  Substance and Sexual Activity   Alcohol use: Yes    Alcohol/week: 14.0 standard drinks of alcohol    Types: 14 Cans of beer per week    Comment: 14 cans of beer weekly   Drug use: No   Sexual activity: Not on file  Other Topics Concern   Not on file  Social History Narrative   ** Merged History Encounter **       Lives in [place], [place].   Social Determinants of Health   Financial Resource Strain: Not on file  Food Insecurity: Not on file  Transportation Needs: Not on file  Physical Activity: Not on file  Stress: Not on file  Social Connections: Not on file  Intimate Partner Violence: Not on file    Family History  Problem Relation Age of Onset   CAD Sister    CAD Brother    Stroke Brother    Heart attack Mother    Coronary artery disease Father    Heart attack Sister     ROS: no fevers or chills, productive cough, hemoptysis, dysphasia, odynophagia, melena, hematochezia, dysuria, hematuria, rash, seizure activity, orthopnea, PND, pedal edema, claudication. Remaining systems are negative.  Physical Exam: Well-developed well-nourished in no acute distress.  Skin is warm and dry.  HEENT is normal.  Neck is supple.  Chest is clear to auscultation with normal expansion.  Cardiovascular exam is regular rate and rhythm.  Abdominal exam nontender or distended. No masses palpated. Extremities show no edema. neuro grossly intact  EKG Interpretation Date/Time:  Wednesday November 10 2022 15:54:56 EDT Ventricular Rate:  67 PR Interval:  168 QRS Duration:  106 QT Interval:  392 QTC Calculation: 414 R Axis:   -22  Text Interpretation: Normal sinus rhythm Normal ECG When compared with ECG of 16-Apr-2016 06:39, Questionable change in QRS axis ST no longer depressed in Lateral leads T wave inversion no longer evident in Lateral leads Confirmed by Olga Millers ([id number]) on 11/10/2022 4:07:04 PM    A/P  1 coronary artery  disease-patient denies chest pain.  Continue aspirin.  Patient is intolerant to statins.  2 hypertension-patient's blood pressure is controlled.  Continue present medications and follow.  3 hyperlipidemia-continue Repatha.  He is intolerant to statins.  4 carotid artery disease-plan follow-up carotid Dopplers May 2025.  5 ischemic cardiomyopathy-continue beta-blocker and ACE inhibitor.  Repeat echocardiogram.   Olga Millers, MD

## 2022-11-10 ENCOUNTER — Ambulatory Visit: Payer: Medicare HMO | Admitting: Cardiology

## 2022-11-10 ENCOUNTER — Encounter: Payer: Self-pay | Admitting: Cardiology

## 2022-11-10 VITALS — BP 135/79 | HR 67 | Ht 70.0 in | Wt 191.1 lb

## 2022-11-10 DIAGNOSIS — I255 Ischemic cardiomyopathy: Secondary | ICD-10-CM | POA: Diagnosis not present

## 2022-11-10 DIAGNOSIS — I6523 Occlusion and stenosis of bilateral carotid arteries: Secondary | ICD-10-CM

## 2022-11-10 DIAGNOSIS — R9431 Abnormal electrocardiogram [ECG] [EKG]: Secondary | ICD-10-CM | POA: Diagnosis not present

## 2022-11-10 DIAGNOSIS — I2510 Atherosclerotic heart disease of native coronary artery without angina pectoris: Secondary | ICD-10-CM

## 2022-11-10 DIAGNOSIS — I10 Essential (primary) hypertension: Secondary | ICD-10-CM | POA: Diagnosis not present

## 2022-11-10 DIAGNOSIS — E785 Hyperlipidemia, unspecified: Secondary | ICD-10-CM

## 2022-11-10 NOTE — Patient Instructions (Signed)
    Lab Work:  Your physician recommends that you return for lab work FASTING  If you have labs (blood work) drawn today and your tests are completely normal, you will receive your results only by: MyChart Message (if you have MyChart) OR A paper copy in the mail If you have any lab test that is abnormal or we need to change your treatment, we will call you to review the results.   Testing/Procedures:  Your physician has requested that you have an echocardiogram. Echocardiography is a painless test that uses sound waves to create images of your heart. It provides your doctor with information about the size and shape of your heart and how well your heart's chambers and valves are working. This procedure takes approximately one hour. There are no restrictions for this procedure. Please do NOT wear cologne, perfume, aftershave, or lotions (deodorant is allowed). Please arrive 15 minutes prior to your appointment time. [address]   Follow-Up: At [facility], you and your health needs are our priority.  As part of our continuing mission to provide you with exceptional heart care, we have created designated Provider Care Teams.  These Care Teams include your primary Cardiologist (physician) and Advanced Practice Providers (APPs -  Physician Assistants and Nurse Practitioners) who all work together to provide you with the care you need, when you need it.  We recommend signing up for the patient portal called "MyChart".  Sign up information is provided on this After Visit Summary.  MyChart is used to connect with patients for Virtual Visits (Telemedicine).  Patients are able to view lab/test results, encounter notes, upcoming appointments, etc.  Non-urgent messages can be sent to your provider as well.   To learn more about what you can do with MyChart, go to [email or web address].    Your next appointment:   12 month(s)  Provider:   Olga Millers, MD

## 2022-11-26 ENCOUNTER — Ambulatory Visit (HOSPITAL_COMMUNITY): Payer: Medicare HMO | Attending: Cardiology

## 2022-11-26 DIAGNOSIS — I255 Ischemic cardiomyopathy: Secondary | ICD-10-CM | POA: Diagnosis present

## 2022-11-26 LAB — ECHOCARDIOGRAM COMPLETE
Area-P 1/2: 4.02 cm2
S' Lateral: 4.3 cm

## 2022-11-27 LAB — LIPID PANEL
Chol/HDL Ratio: 2 {ratio} (ref 0.0–5.0)
Cholesterol, Total: 94 mg/dL — ABNORMAL LOW (ref 100–199)
HDL: 46 mg/dL (ref >39)
LDL Chol Calc (NIH): 36 mg/dL (ref 0–99)
Triglycerides: 50 mg/dL (ref 0–149)
VLDL Cholesterol Cal: 12 mg/dL (ref 5–40)

## 2022-11-27 LAB — COMPREHENSIVE METABOLIC PANEL
ALT: 18 [IU]/L (ref 0–44)
AST: 18 [IU]/L (ref 0–40)
Albumin: 4.4 g/dL (ref 3.9–4.9)
Alkaline Phosphatase: 64 [IU]/L (ref 44–121)
BUN/Creatinine Ratio: 10 (ref 10–24)
BUN: 8 mg/dL (ref 8–27)
Bilirubin Total: 0.3 mg/dL (ref 0.0–1.2)
CO2: 26 mmol/L (ref 20–29)
Calcium: 9 mg/dL (ref 8.6–10.2)
Chloride: 105 mmol/L (ref 96–106)
Creatinine, Ser: 0.82 mg/dL (ref 0.76–1.27)
Globulin, Total: 1.7 g/dL (ref 1.5–4.5)
Glucose: 113 mg/dL — ABNORMAL HIGH (ref 70–99)
Potassium: 4.3 mmol/L (ref 3.5–5.2)
Sodium: 143 mmol/L (ref 134–144)
Total Protein: 6.1 g/dL (ref 6.0–8.5)
eGFR: 95 mL/min/{1.73_m2} (ref >59)

## 2022-11-29 ENCOUNTER — Encounter: Payer: Self-pay | Admitting: *Deleted

## 2022-12-01 ENCOUNTER — Encounter: Payer: Self-pay | Admitting: *Deleted

## 2022-12-31 ENCOUNTER — Other Ambulatory Visit: Payer: Self-pay | Admitting: Cardiology

## 2022-12-31 DIAGNOSIS — I2510 Atherosclerotic heart disease of native coronary artery without angina pectoris: Secondary | ICD-10-CM

## 2022-12-31 DIAGNOSIS — E785 Hyperlipidemia, unspecified: Secondary | ICD-10-CM

## 2023-03-22 ENCOUNTER — Other Ambulatory Visit: Payer: Self-pay | Admitting: Cardiology

## 2023-03-22 DIAGNOSIS — I2510 Atherosclerotic heart disease of native coronary artery without angina pectoris: Secondary | ICD-10-CM

## 2023-03-22 DIAGNOSIS — E785 Hyperlipidemia, unspecified: Secondary | ICD-10-CM

## 2023-03-23 ENCOUNTER — Other Ambulatory Visit: Payer: Self-pay | Admitting: Cardiology

## 2023-06-10 ENCOUNTER — Other Ambulatory Visit: Payer: Self-pay | Admitting: *Deleted

## 2023-06-10 DIAGNOSIS — I6523 Occlusion and stenosis of bilateral carotid arteries: Secondary | ICD-10-CM

## 2023-06-12 ENCOUNTER — Other Ambulatory Visit: Payer: Self-pay | Admitting: Cardiology

## 2023-06-12 DIAGNOSIS — I2510 Atherosclerotic heart disease of native coronary artery without angina pectoris: Secondary | ICD-10-CM

## 2023-06-12 DIAGNOSIS — E785 Hyperlipidemia, unspecified: Secondary | ICD-10-CM

## 2023-08-07 ENCOUNTER — Other Ambulatory Visit: Payer: Self-pay | Admitting: Cardiology

## 2023-08-17 ENCOUNTER — Other Ambulatory Visit: Payer: Self-pay | Admitting: Cardiology

## 2023-09-21 ENCOUNTER — Telehealth: Payer: Self-pay | Admitting: Cardiology

## 2023-09-21 NOTE — Telephone Encounter (Signed)
 Spoke with pt, Aware of dr Ludwig Clarks recommendations.

## 2023-09-21 NOTE — Telephone Encounter (Signed)
 Pt c/o medication issue:  1. Name of Medication:   aspirin  81 MG chewable tablet   2. How are you currently taking this medication (dosage and times per day)?   1 tablet at bedtime  3. Are you having a reaction (difficulty breathing--STAT)?   4. What is your medication issue?   Patient stated he had a tooth extracted today (7/30) and was told to hold this medication.  Patient wants a call back to discuss this medication change.

## 2023-09-21 NOTE — Telephone Encounter (Signed)
 Spoke to patient he stated he had 1 tooth extracted this morning.Stated he did not hold Aspirin  prior to extraction.He was told to ask Dr.Crenshaw how long he needs to hold Aspirin  after extraction.He has changed gauze twice since 8:00 am,very little bleeding.Message sent to Dr.Crenshaw.

## 2023-10-21 ENCOUNTER — Other Ambulatory Visit: Payer: Self-pay | Admitting: Cardiology

## 2023-10-29 ENCOUNTER — Other Ambulatory Visit: Payer: Self-pay | Admitting: Cardiology

## 2024-01-02 ENCOUNTER — Other Ambulatory Visit: Payer: Self-pay | Admitting: Cardiology

## 2024-01-11 NOTE — Progress Notes (Signed)
 HPI: FU CAD. Patient had PCI of his RCA in August 2010. Patient was admitted February 2018 with acute anterior infarct. He was found to have a 99% proximal LAD which was treated with a drug-eluting stent. Had CVA 8/18 followed by R CEA 8/18. Abdominal ultrasound March 2021 showed no aneurysm.  Carotid Dopplers May 2024 showed patent carotid endarterectomy site on the right and 1 to 39% left stenosis.  Echocardiogram repeated October 2024 and showed ejection fraction 45 to 50%.  Since last seen, he denies dyspnea, chest pain, palpitations or syncope.  Current Outpatient Medications  Medication Sig Dispense Refill   Ascorbic Acid (VITAMIN C) 1000 MG tablet Take 1,000 mg by mouth daily.     aspirin  81 MG chewable tablet Chew 1 tablet (81 mg total) by mouth daily. (Patient taking differently: Chew 81 mg by mouth at bedtime.)     carvedilol  (COREG ) 12.5 MG tablet TAKE 1 TABLET TWICE DAILY 60 tablet 0   Cholecalciferol 125 MCG (5000 UT) capsule Take 5,000 Units by mouth at bedtime.      Evolocumab  (REPATHA  SURECLICK) 140 MG/ML SOAJ INJECT 140 MG INTO THE SKIN EVERY 14 DAYS. 6 mL 3   ibuprofen (ADVIL,MOTRIN) 200 MG tablet Take 200 mg by mouth every 6 (six) hours as needed.     lisinopril  (ZESTRIL ) 10 MG tablet TAKE 1 TABLET EVERY DAY 15 tablet 0   nitroGLYCERIN  (NITROSTAT ) 0.4 MG SL tablet Place 1 tablet (0.4 mg total) under the tongue every 5 (five) minutes as needed. For chest pain.  May repeat times three. 50 tablet 3   NON FORMULARY Take 750 drops by mouth as needed (for sleep). CBD oil     sildenafil (REVATIO) 20 MG tablet Take 20 mg by mouth daily as needed.     tamsulosin  (FLOMAX ) 0.4 MG CAPS capsule Take 1 capsule (0.4 mg total) by mouth daily. (Patient taking differently: Take 0.4 mg by mouth daily as needed (prostate pain).) 30 capsule 0   No current facility-administered medications for this visit.     Past Medical History:  Diagnosis Date   CAD (coronary artery disease)     Cardiomyopathy    Carotid stenosis, right    Cataract    B/L   Cerebrovascular disease    Chronic pain    B/LLE   Coronary artery disease    Dyslipidemia    Enlarged prostate    History of cardiac cath    Hypertension    Benign   Hypertension    Myocardial infarction (HCC)    Numbness and tingling in left hand    S/P angioplasty with stent, 04/15/16  DES to prox LAD 04/17/2016   Stroke (HCC)    TIA (transient ischemic attack)     Past Surgical History:  Procedure Laterality Date   CARDIAC CATHETERIZATION     CAROTID ENDARTERECTOMY     CORONARY STENT INTERVENTION N/A 04/15/2016   Procedure: Coronary Stent Intervention;  Surgeon: Candyce GORMAN Reek, MD;  Location: [facility] LAB;  Service: Cardiovascular;  Laterality: N/A;   ENDARTERECTOMY Right 10/05/2016   Procedure: ENDARTERECTOMY RIGHT CAROTID ARTERY;  Surgeon: Laurence Redell CROME, MD;  Location: [facility] OR;  Service: Vascular;  Laterality: Right;   LEFT HEART CATH AND CORONARY ANGIOGRAPHY N/A 04/15/2016   Procedure: Left Heart Cath and Coronary Angiography;  Surgeon: Candyce GORMAN Reek, MD;  Location: [facility] INVASIVE CV LAB;  Service: Cardiovascular;  Laterality: N/A;   MULTIPLE TOOTH EXTRACTIONS     PATCH ANGIOPLASTY  Right 10/05/2016   Procedure: RIGHT CAROTID ARTERY PATCH ANGIOPLASTY USING GEORGE VASCULAR PATCH;  Surgeon: Laurence Redell CROME, MD;  Location: [facility] OR;  Service: Vascular;  Laterality: Right;    Social History   Socioeconomic History   Marital status: Married    Spouse name: Not on file   Number of children: Not on file   Years of education: Not on file   Highest education level: Not on file  Occupational History   Occupation: Investment Banker, Corporate: [employer]  Tobacco Use   Smoking status: Former    Types: Cigarettes   Smokeless tobacco: Never   Tobacco comments:    Does not smoke.  Vaping Use   Vaping status: Never Used  Substance and Sexual Activity   Alcohol use: Yes    Alcohol/week: 14.0 standard drinks  of alcohol    Types: 14 Cans of beer per week    Comment: 14 cans of beer weekly   Drug use: No   Sexual activity: Not on file  Other Topics Concern   Not on file  Social History Narrative   ** Merged History Encounter **       Lives in [place], [place].   Social Drivers of Corporate Investment Banker Strain: Not on file  Food Insecurity: Not on file  Transportation Needs: Not on file  Physical Activity: Not on file  Stress: No Stress Concern Present (12/21/2022)   Received from [facility] of Occupational Health - Occupational Stress Questionnaire    Feeling of Stress : Not at all  Social Connections: Not on file  Intimate Partner Violence: Not At Risk (12/21/2022)   Received from [facility]   HITS    Over the last 12 months how often did your partner physically hurt you?: Never    Over the last 12 months how often did your partner insult you or talk down to you?: Never    Over the last 12 months how often did your partner threaten you with physical harm?: Never    Over the last 12 months how often did your partner scream or curse at you?: Never    Family History  Problem Relation Age of Onset   CAD Sister    CAD Brother    Stroke Brother    Heart attack Mother    Coronary artery disease Father    Heart attack Sister     ROS: no fevers or chills, productive cough, hemoptysis, dysphasia, odynophagia, melena, hematochezia, dysuria, hematuria, rash, seizure activity, orthopnea, PND, pedal edema, claudication. Remaining systems are negative.  Physical Exam: Well-developed well-nourished in no acute distress.  Skin is warm and dry.  HEENT is normal.  Neck is supple.  Chest is clear to auscultation with normal expansion.  Cardiovascular exam is regular rate and rhythm.  Abdominal exam nontender or distended. No masses palpated. Extremities show no edema. neuro grossly intact  EKG Interpretation Date/Time:  Wednesday January 18 2024 15:53:57  EST Ventricular Rate:  84 PR Interval:  168 QRS Duration:  84 QT Interval:  362 QTC Calculation: 427 R Axis:   -9  Text Interpretation: Normal sinus rhythm Normal ECG Confirmed by Pietro Redell ([id number]) on 01/18/2024 3:54:59 PM    A/P  1 ischemic cardiomyopathy-continue ACE inhibitor and beta-blocker.  LV function mildly reduced.  2 coronary artery disease-patient is not having chest pain.  Continue aspirin .  Intolerant to statins.  3 hyperlipidemia-continue Repatha .  Patient is intolerant to statins.  Check lipids and liver.  4 hypertension-blood pressure controlled.  Continue present medications.  Check potassium and renal function.  5 carotid artery disease-will arrange follow-up carotid Dopplers.  Redell Shallow, MD

## 2024-01-12 ENCOUNTER — Other Ambulatory Visit: Payer: Self-pay | Admitting: Cardiology

## 2024-01-18 ENCOUNTER — Encounter: Payer: Self-pay | Admitting: Cardiology

## 2024-01-18 ENCOUNTER — Ambulatory Visit: Admitting: Cardiology

## 2024-01-18 VITALS — BP 134/69 | HR 84 | Ht 70.0 in | Wt 179.0 lb

## 2024-01-18 DIAGNOSIS — I2510 Atherosclerotic heart disease of native coronary artery without angina pectoris: Secondary | ICD-10-CM | POA: Diagnosis not present

## 2024-01-18 DIAGNOSIS — E785 Hyperlipidemia, unspecified: Secondary | ICD-10-CM

## 2024-01-18 DIAGNOSIS — I10 Essential (primary) hypertension: Secondary | ICD-10-CM

## 2024-01-18 DIAGNOSIS — I6523 Occlusion and stenosis of bilateral carotid arteries: Secondary | ICD-10-CM | POA: Diagnosis not present

## 2024-01-18 DIAGNOSIS — I255 Ischemic cardiomyopathy: Secondary | ICD-10-CM

## 2024-01-18 NOTE — Patient Instructions (Signed)
   Testing/Procedures:  Your physician has requested that you have a carotid duplex. This test is an ultrasound of the carotid arteries in your neck. It looks at blood flow through these arteries that supply the brain with blood. Allow one hour for this exam. There are no restrictions or special instructions. [facility]  Follow-Up: At [facility], you and your health needs are our priority.  As part of our continuing mission to provide you with exceptional heart care, our providers are all part of one team.  This team includes your primary Cardiologist (physician) and Advanced Practice Providers or APPs (Physician Assistants and Nurse Practitioners) who all work together to provide you with the care you need, when you need it.  Your next appointment:   12 month(s)  Provider:   Redell Shallow, MD

## 2024-01-25 ENCOUNTER — Telehealth: Payer: Self-pay | Admitting: Pharmacy Technician

## 2024-01-25 NOTE — Telephone Encounter (Signed)
     [facility]

## 2024-01-26 ENCOUNTER — Ambulatory Visit: Payer: Self-pay | Admitting: Cardiology

## 2024-01-26 LAB — LIPID PANEL
Chol/HDL Ratio: 2.5 ratio (ref 0.0–5.0)
Cholesterol, Total: 111 mg/dL (ref 100–199)
HDL: 44 mg/dL (ref >39)
LDL Chol Calc (NIH): 53 mg/dL (ref 0–99)
Triglycerides: 63 mg/dL (ref 0–149)
VLDL Cholesterol Cal: 14 mg/dL (ref 5–40)

## 2024-01-26 LAB — COMPREHENSIVE METABOLIC PANEL WITH GFR
ALT: 23 IU/L (ref 0–44)
AST: 17 IU/L (ref 0–40)
Albumin: 4.3 g/dL (ref 3.8–4.8)
Alkaline Phosphatase: 74 IU/L (ref 47–123)
BUN/Creatinine Ratio: 11 (ref 10–24)
BUN: 10 mg/dL (ref 8–27)
Bilirubin Total: 0.4 mg/dL (ref 0.0–1.2)
CO2: 25 mmol/L (ref 20–29)
Calcium: 9.9 mg/dL (ref 8.6–10.2)
Chloride: 100 mmol/L (ref 96–106)
Creatinine, Ser: 0.9 mg/dL (ref 0.76–1.27)
Globulin, Total: 2.8 g/dL (ref 1.5–4.5)
Glucose: 99 mg/dL (ref 70–99)
Potassium: 5.1 mmol/L (ref 3.5–5.2)
Sodium: 140 mmol/L (ref 134–144)
Total Protein: 7.1 g/dL (ref 6.0–8.5)
eGFR: 91 mL/min/1.73 (ref >59)

## 2024-01-27 ENCOUNTER — Ambulatory Visit

## 2024-01-27 DIAGNOSIS — I6523 Occlusion and stenosis of bilateral carotid arteries: Secondary | ICD-10-CM

## 2024-02-02 ENCOUNTER — Other Ambulatory Visit: Payer: Self-pay | Admitting: Cardiology
